# Patient Record
Sex: Male | Born: 1955 | Race: White | Hispanic: No | Marital: Single | Smoking: Current every day smoker
Health system: Southern US, Community
[De-identification: ages and names within clinical notes are randomized; demographics above are authoritative.]

## PROBLEM LIST (undated history)

## (undated) DIAGNOSIS — J45909 Unspecified asthma, uncomplicated: Secondary | ICD-10-CM

## (undated) DIAGNOSIS — F32A Depression, unspecified: Secondary | ICD-10-CM

## (undated) DIAGNOSIS — C801 Malignant (primary) neoplasm, unspecified: Secondary | ICD-10-CM

## (undated) DIAGNOSIS — I10 Essential (primary) hypertension: Secondary | ICD-10-CM

## (undated) DIAGNOSIS — D638 Anemia in other chronic diseases classified elsewhere: Secondary | ICD-10-CM

## (undated) DIAGNOSIS — K259 Gastric ulcer, unspecified as acute or chronic, without hemorrhage or perforation: Secondary | ICD-10-CM

## (undated) DIAGNOSIS — F329 Major depressive disorder, single episode, unspecified: Secondary | ICD-10-CM

## (undated) DIAGNOSIS — F319 Bipolar disorder, unspecified: Secondary | ICD-10-CM

## (undated) DIAGNOSIS — M1990 Unspecified osteoarthritis, unspecified site: Secondary | ICD-10-CM

## (undated) DIAGNOSIS — F419 Anxiety disorder, unspecified: Secondary | ICD-10-CM

## (undated) DIAGNOSIS — M109 Gout, unspecified: Secondary | ICD-10-CM

## (undated) DIAGNOSIS — R0600 Dyspnea, unspecified: Secondary | ICD-10-CM

## (undated) DIAGNOSIS — Z992 Dependence on renal dialysis: Secondary | ICD-10-CM

## (undated) DIAGNOSIS — D696 Thrombocytopenia, unspecified: Secondary | ICD-10-CM

## (undated) DIAGNOSIS — R51 Headache: Secondary | ICD-10-CM

## (undated) DIAGNOSIS — K7460 Unspecified cirrhosis of liver: Secondary | ICD-10-CM

## (undated) DIAGNOSIS — B1920 Unspecified viral hepatitis C without hepatic coma: Secondary | ICD-10-CM

## (undated) DIAGNOSIS — F209 Schizophrenia, unspecified: Secondary | ICD-10-CM

## (undated) DIAGNOSIS — R519 Headache, unspecified: Secondary | ICD-10-CM

## (undated) DIAGNOSIS — N289 Disorder of kidney and ureter, unspecified: Secondary | ICD-10-CM

## (undated) DIAGNOSIS — I482 Chronic atrial fibrillation, unspecified: Secondary | ICD-10-CM

## (undated) DIAGNOSIS — K219 Gastro-esophageal reflux disease without esophagitis: Secondary | ICD-10-CM

## (undated) DIAGNOSIS — N186 End stage renal disease: Secondary | ICD-10-CM

## (undated) DIAGNOSIS — J449 Chronic obstructive pulmonary disease, unspecified: Secondary | ICD-10-CM

## (undated) HISTORY — PX: PERITONEAL WOUND DRAINAGE: SUR425

## (undated) HISTORY — DX: Unspecified asthma, uncomplicated: J45.909

## (undated) HISTORY — DX: Unspecified osteoarthritis, unspecified site: M19.90

## (undated) HISTORY — DX: Gout, unspecified: M10.9

## (undated) HISTORY — DX: Gastro-esophageal reflux disease without esophagitis: K21.9

## (undated) HISTORY — DX: Major depressive disorder, single episode, unspecified: F32.9

## (undated) HISTORY — DX: Anxiety disorder, unspecified: F41.9

## (undated) HISTORY — DX: Depression, unspecified: F32.A

## (undated) HISTORY — DX: Gastric ulcer, unspecified as acute or chronic, without hemorrhage or perforation: K25.9

---

## 2012-02-22 HISTORY — PX: DIALYSIS FISTULA CREATION: SHX611

## 2013-02-21 HISTORY — PX: CARDIAC DEFIBRILLATOR PLACEMENT: SHX171

## 2015-06-29 ENCOUNTER — Encounter: Payer: Self-pay | Admitting: Emergency Medicine

## 2015-06-29 ENCOUNTER — Emergency Department
Admission: EM | Admit: 2015-06-29 | Discharge: 2015-06-29 | Disposition: A | Discharge status: Home / Self Care | Payer: Medicaid Other | Attending: Emergency Medicine | Admitting: Emergency Medicine

## 2015-06-29 DIAGNOSIS — N186 End stage renal disease: Secondary | ICD-10-CM | POA: Insufficient documentation

## 2015-06-29 DIAGNOSIS — F17200 Nicotine dependence, unspecified, uncomplicated: Secondary | ICD-10-CM | POA: Diagnosis not present

## 2015-06-29 DIAGNOSIS — R609 Edema, unspecified: Secondary | ICD-10-CM

## 2015-06-29 DIAGNOSIS — I4891 Unspecified atrial fibrillation: Secondary | ICD-10-CM | POA: Insufficient documentation

## 2015-06-29 DIAGNOSIS — R600 Localized edema: Secondary | ICD-10-CM | POA: Insufficient documentation

## 2015-06-29 DIAGNOSIS — J449 Chronic obstructive pulmonary disease, unspecified: Secondary | ICD-10-CM | POA: Diagnosis not present

## 2015-06-29 DIAGNOSIS — Z992 Dependence on renal dialysis: Secondary | ICD-10-CM | POA: Diagnosis not present

## 2015-06-29 HISTORY — DX: Chronic obstructive pulmonary disease, unspecified: J44.9

## 2015-06-29 LAB — CBC WITH DIFFERENTIAL/PLATELET
BASOS ABS: 0 10*3/uL (ref 0–0.1)
EOS ABS: 0.3 10*3/uL (ref 0–0.7)
Eosinophils Relative: 8 %
HCT: 34.3 % — ABNORMAL LOW (ref 40.0–52.0)
Hemoglobin: 11.7 g/dL — ABNORMAL LOW (ref 13.0–18.0)
Lymphocytes Relative: 19 %
Lymphs Abs: 0.7 10*3/uL — ABNORMAL LOW (ref 1.0–3.6)
MCH: 33.5 pg (ref 26.0–34.0)
MCHC: 34.1 g/dL (ref 32.0–36.0)
MCV: 98.2 fL (ref 80.0–100.0)
MONO ABS: 0.3 10*3/uL (ref 0.2–1.0)
NEUTROS ABS: 2.3 10*3/uL (ref 1.4–6.5)
Neutrophils Relative %: 64 %
PLATELETS: 55 10*3/uL — AB (ref 150–440)
RBC: 3.5 MIL/uL — ABNORMAL LOW (ref 4.40–5.90)
RDW: 15.2 % — AB (ref 11.5–14.5)
WBC: 3.7 10*3/uL — ABNORMAL LOW (ref 3.8–10.6)

## 2015-06-29 LAB — BASIC METABOLIC PANEL
ANION GAP: 15 (ref 5–15)
BUN: 57 mg/dL — ABNORMAL HIGH (ref 6–20)
CALCIUM: 8.9 mg/dL (ref 8.9–10.3)
CO2: 20 mmol/L — AB (ref 22–32)
CREATININE: 8.47 mg/dL — AB (ref 0.61–1.24)
Chloride: 104 mmol/L (ref 101–111)
GFR, EST AFRICAN AMERICAN: 7 mL/min — AB (ref >60)
GFR, EST NON AFRICAN AMERICAN: 6 mL/min — AB (ref >60)
GLUCOSE: 89 mg/dL (ref 65–99)
Potassium: 3.9 mmol/L (ref 3.5–5.1)
Sodium: 139 mmol/L (ref 135–145)

## 2015-06-29 MED ORDER — NICOTINE POLACRILEX 2 MG MT GUM
2.0000 mg | CHEWING_GUM | OROMUCOSAL | Status: DC | PRN
  Filled 2015-06-29: qty 1

## 2015-06-29 NOTE — Care Management (Addendum)
This Probation officer has emailed [employer] liaison for outpatient dialysis after CSW has sent text with urgent need to call discharge planners back.  I spoke with [facility] and they have advised that Antoine Primas [phone] be contacted regarding new dialysis referral. I also spoke with CSW at  [facility] and Dr. Alfonse Alpers has declined patient. I have left message for Lawanna with [facility] to call this RNCM back to discuss "where" patient had dialysis Friday. Apparently patient was just released from jail. CSW is going to speak now with patient.  This Probation officer contacted Antoine Primas with [facility] [phone] to check status of this patient. They do have an open request --from [place] ([place] Ph: [phone] ext. [phone] )- 06/17/15 blood labs were sent. PPD good for 90 days. Hep antigen 30 days. Right now they are up to date on labs. They do not see that Medicaid has been applied for. This Probation officer spoke with [facility] in ED and patient currently does not meet criteria for admission.  This Probation officer attempted to contact [facility] regarding patient status--We need copy of patient's insurance. I was on hold for an extended amount of time and then when I called back number was busy. I have re-contacted [facility] central intake to see if they have a copy of patient's insurance card on file. No insurance on file. Social Security number shared with [facility]- they are going to run insurance (Medicare/Medicaid) and call Mel [facility] CSW back with result. Lawanna called back and prison had resent Medicaid-  But it termed out. Prison said that he would be reactivated. This Probation officer has also requested Medicaid status check with [facility].

## 2015-06-29 NOTE — Clinical Social Work Note (Signed)
Clinical Social Work Assessment  Patient Details  Name: Richard Herring MRN: [id number] Date of Birth: [birth year]  Date of referral:  06/29/15               Reason for consult:  Financial Concerns, Insurance Barriers, [employer], Other (Comment Required) (outpatient dialysis placement )                Permission sought to share information with:  Case Freight forwarder, Other ([place] ) Permission granted to share information::  Yes, Verbal Permission Granted  Name::        Agency::     Relationship::     Contact Information:     Housing/Transportation Living arrangements for the past 2 months:  Catering manager of Information:  Patient Patient Interpreter Needed:  None Criminal Activity/Legal Involvement Pertinent to Current Situation/Hospitalization:  No - Comment as needed Significant Relationships:  Siblings Lives with:  Facility Resident Do you feel safe going back to the place where you live?  Yes Need for family participation in patient care:  No ([place])  Care giving concerns: Patient was released from [facility] on Saturday 06/27/15 and was placed at [facility] in [place].    Social Worker assessment / plan:  Holiday representative ([place]) received a consult for outpatient dialysis center placement. CSW met with patient alone at bedside. Patient was alert and oriented and stated that he was released from  [facility] on Saturday 06/27/15 and has been in prison since 2007. Per patient he was in prison for theft of cars. Patient reported that he was placed from the prison to [facility] in [place]. Per patient the social worker at the prison attempted to find an outpatient dialysis center however they were full in [place]. Patient reported that he cannot read, write or spell. Patient reported that the group home will not give him any information about his parole, his SSI or Medicare or Medicaid.  Patient stated that he does have Medicare and Medicaid but does not know his information. Patient gave CSW permission to contact is Research officer, trade union.   RN Case Manager is assisting with case and has contacted [facility] and the group home. CSW contacted patient's parole officer [place] [phone]. Per parole officer she is scheduled to have a meeting with him tomorrow at 4 pm. Per officer she does not have a lot of information about patient but did state [facility] prison case Freight forwarder was Mrs. Farrley [phone]. RN Case Manager is attempting to reach case manager at [facility]. CSW will continue to follow and assist as needed.    Employment status:  Disabled (Comment on whether or not currently receiving Disability), Unemployed Insurance information:  Self Pay (Medicaid Pending) PT Recommendations:  Not assessed at this time Information / Referral to community resources:  Other (Comment Required) (outpatient dialysis center placement)  Patient/Family's Response to care: Patient is open to getting placed at any outpatient dialysis center that will accept him.   Patient/Family's Understanding of and Emotional Response to Diagnosis, Current Treatment, and Prognosis:  Patient was very pleasant and does not have an outpatient dialysis center placement.   Emotional Assessment Appearance:  Appears stated age Attitude/Demeanor/Rapport:    Affect (typically observed):  Accepting, Adaptable, Pleasant Orientation:  Oriented to Self, Oriented to Place, Oriented to  Time, Oriented to Situation Alcohol / Substance use:  Not Applicable Psych involvement (Current and /or in the community):  No (Comment)  Discharge Needs  Concerns to be addressed:  Discharge Planning Concerns Readmission within the last 30 days:  No Current discharge risk:  Other (Outpatient dialysis placement. ) Barriers to Discharge:  Continued Medical Work up, Waiting for outpatient dialysis   Elwyn Reach 06/29/2015, 11:41 AM

## 2015-06-29 NOTE — ED Notes (Signed)
Pt presents needing dialysis. Pt just got out of prison and is trying to get set up with dialysis clinic. Pt had dialysis on Friday.

## 2015-06-29 NOTE — ED Notes (Signed)
Berdine Dance, [place] transporter [phone]

## 2015-06-29 NOTE — Care Management Note (Signed)
I have spoken directly with the facility administrator at [facility] and was informed that Dr. Candiss Norse and Dr Holley Raring have both denied patient admission at the [facility] clinics they cover as Medical Directors.  [facility] has also denied the patient.  When speaking with the Facility Director she read to me the H and P from the  state prison and it states that patient has multiple occurrences of behavior issues.  Records also state that patient has 50 attempted suicide.  When speaking with CSW Blima Rich she informed me that the patient did not present himself as a behavior problem and was very pleasant.  Plan for now is for patient to return to ED tomorrow for follow up labs to see if he is in need of dialysis.  At this time I have no placement for this patient.  I will continue to work on placing him and hope that maybe with a few successful treatments at the hospital the doctors will see that the patient will work well in a outpatient dialysis center.  I plan to also discuss with Dr. Candiss Norse about the possibility that the  patient my be a candidate for home hemo at any of the area clinics.  This would require the willingness of the group home if they are capable of handling the extra time this would require of them. I will continue to follow patient on each admission for labs and if the need arises for hemodialysis.  Richard Herring  Dialysis Coordinator   [phone]

## 2015-06-29 NOTE — ED Notes (Signed)
Group home staff contacted and informed of pt discharge.

## 2015-06-29 NOTE — Discharge Instructions (Signed)
You were evaluated and you do not need emergency dialysis today.  You'll be called by the social worker to discuss outpatient plan for dialysis.  Return to the emergency department immediately for any chest pain, trouble breathing, shortness of breath, palpitations, confusion altered mental status, or any other symptoms concerning to you.  You will need to come tomorrow to the emergency department for repeat blood draw to evaluate whether or not you need emergency dialysis, if a dialysis appointment has not and scheduled as an outpatient for you.   Peripheral Edema You have swelling in your legs (peripheral edema). This swelling is due to excess accumulation of salt and water in your body. Edema may be a sign of heart, kidney or liver disease, or a side effect of a medication. It may also be due to problems in the leg veins. Elevating your legs and using special support stockings may be very helpful, if the cause of the swelling is due to poor venous circulation. Avoid long periods of standing, whatever the cause. Treatment of edema depends on identifying the cause. Chips, pretzels, pickles and other salty foods should be avoided. Restricting salt in your diet is almost always needed. Water pills (diuretics) are often used to remove the excess salt and water from your body via urine. These medicines prevent the kidney from reabsorbing sodium. This increases urine flow. Diuretic treatment may also result in lowering of potassium levels in your body. Potassium supplements may be needed if you have to use diuretics daily. Daily weights can help you keep track of your progress in clearing your edema. You should call your caregiver for follow up care as recommended. SEEK IMMEDIATE MEDICAL CARE IF:   You have increased swelling, pain, redness, or heat in your legs.  You develop shortness of breath, especially when lying down.  You develop chest or abdominal pain, weakness, or fainting.  You have a  fever.   This information is not intended to replace advice given to you by your health care provider. Make sure you discuss any questions you have with your health care provider.   Document Released: 03/17/2004 Document Revised: 05/02/2011 Document Reviewed: 08/20/2014 Elsevier Interactive Patient Education [employer].

## 2015-06-29 NOTE — Progress Notes (Signed)
Per MD patient does not require emergent dialysis and will be discharged back to group home today. Per MD D/C instructions to patient is to return to the ED tomorrow and get blood work to see if he needs dialysis. Cleophus Molt group home owner is aware of above. [facility] liaison is aware of above and is working on outpatient dialysis placement. CSW left [facility] Medicaid worker a Advertising account executive.   Blima Rich, LCSW [phone]

## 2015-06-29 NOTE — Progress Notes (Signed)
Clinical Education officer, museum (CSW) received a call from MD stating that patient was released from prison and needs to be set up with an outpatient dialysis center. Per MD patient stated that the social worker at the prison called the 2 dialysis centers in [place] and they did not have any slots and told patient to come to the emergency room. CSW left [facility]aud[facility] dialysis liaison a voicemail about situation. CSW will continue to follow and assist as needed.   Blima Rich, LCSW [phone]

## 2015-06-29 NOTE — ED Provider Notes (Signed)
[facility] Emergency Department Provider Note   ____________________________________________  Time seen: Approximately 9 AM I have reviewed the triage vital signs and the triage nursing note.  HISTORY  Chief Complaint needs dialysis    Historian Patient  HPI Richard Herring is a 60 y.o. male who was recently released from prison, and receives dialysis Monday Wednesday and Friday, with his last dialysis on Friday. He states that the social worker imprison was trying to get him set up for outpatient dialysis here in [place], but stated that the 2 places were full. They told him to come to the emergency department for dialysis.  No chest pain trouble breathing. He is a smoker. No recent illnesses. He does think he has little bit of fluid on him, and is referring to small amount of leg edema and hand edema.  He is currently staying at a group home.    Past Medical History  Diagnosis Date  . Renal disorder   . COPD (chronic obstructive pulmonary disease) ([place])   . 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 Acute gastroenteritis 07/01/2015    History reviewed. No pertinent past surgical history.  No current outpatient prescriptions on file.  Allergies Review of patient's allergies indicates no known allergies.  History reviewed. No pertinent family history.  Social History Social History  Substance Use Topics  . Smoking status: Current Every Day Smoker  . Smokeless tobacco: None  . Alcohol Use: No    Review of Systems  Constitutional: Negative for fever. Eyes: Negative for visual changes. ENT: Negative for sore throat. Cardiovascular: Negative for chest pain. Respiratory: Negative for shortness of breath. Gastrointestinal: Negative for abdominal pain, vomiting and diarrhea. Genitourinary: Negative for dysuria. Musculoskeletal: Negative for back pain. Skin:  Negative for rash. Neurological: Negative for headache. 10 point Review of Systems otherwise negative ____________________________________________   PHYSICAL EXAM:  VITAL SIGNS: ED Triage Vitals  Enc Vitals Group     BP 06/29/15 0822 123/68 mmHg     Pulse Rate 06/29/15 0822 62     Resp 06/29/15 0822 18     Temp 06/29/15 0822 97.8 F (36.6 C)     Temp Source 06/29/15 0822 Oral     SpO2 06/29/15 0822 96 %     Weight 06/29/15 0822 138 lb (62.596 kg)     Height 06/29/15 0822 '5\' 11"'$  (1.803 m)     Head Cir --      Peak Flow --      Pain Score 06/29/15 0824 6     Pain Loc --      Pain Edu? --      Excl. in [place]? --      Constitutional: Alert and oriented. Well appearing and in no distress. HEENT   Head: Normocephalic and atraumatic.      Eyes: Conjunctivae are normal. PERRL. Normal extraocular movements.      Ears:         Nose: No congestion/rhinnorhea.   Mouth/Throat: Mucous membranes are moist.   Neck: No stridor. Cardiovascular/Chest: Normal rate, regular rhythm.  No murmurs, rubs, or gallops. Respiratory: Normal respiratory effort without tachypnea nor retractions. Breath sounds are clear and equal bilaterally. No wheezes/rales/rhonchi. Gastrointestinal: Soft. No distention, no guarding, no rebound. Nontender.    Genitourinary/rectal:Deferred Musculoskeletal: Left upper extremity dialysis access. Neurologic:  Normal speech and language. No gross or focal neurologic deficits are appreciated. Skin:  Skin  is warm, dry and intact. No rash noted. Psychiatric: Mood and affect are normal. Speech and behavior are normal. Patient exhibits appropriate insight and judgment.  ____________________________________________   EKG I, Lisa Roca, MD, the attending physician have personally viewed and interpreted all ECGs.  None ____________________________________________  LABS (pertinent positives/negatives)  Basic metabolic panel significant for BUN 57 and creatinine  8.47, potassium is 3.9 White blood count 3.7, hemoglobin 11.7 and platelet count 55  ____________________________________________  RADIOLOGY All Xrays were viewed by me. Imaging interpreted by Radiologist.  none __________________________________________  PROCEDURES  Procedure(s) performed: None  Critical Care performed: None  ____________________________________________   ED COURSE / ASSESSMENT AND PLAN  Pertinent labs & imaging results that were available during my care of the patient were reviewed by me and considered in my medical decision making (see chart for details).   I ordered labs to see if the patient needs emergency dialysis, and have social worker help with outpatient follow-up for dialysis.   No hyperkalemia, and no hypoxia or trouble breathing, no indication for acute/emergent dialysis at this moment.  Social worker Transport planner working with dialysis coordinator to try to figure out a solution for outpatient routine dialysis for this patient.  Patient would like to go ahead and be discharged, and daily will call the group home to follow-up with this plan.  I've asked him to come back tomorrow for blood work and daily until he has a scheduled outpatient dialysis to monitor for the need of emergent dialysis. Certainly we also discussed symptomatic reasons for return to emergency department.    CONSULTATIONS:   Education officer, museum, Transport planner.  Patient / Family / Caregiver informed of clinical course, medical decision-making process, and agree with plan.   I discussed return precautions, follow-up instructions, and discharged instructions with patient and/or family.   ___________________________________________   FINAL CLINICAL IMPRESSION(S) / ED DIAGNOSES   Final diagnoses:  Peripheral edema              Note: This dictation was prepared with Dragon dictation. Any transcriptional errors that result from this process are unintentional   Lisa Roca, MD 07/02/15 [phone]

## 2015-06-30 ENCOUNTER — Emergency Department
Admission: EM | Admit: 2015-06-30 | Discharge: 2015-06-30 | Disposition: A | Discharge status: Home / Self Care | Payer: Medicaid Other | Attending: Emergency Medicine | Admitting: Emergency Medicine

## 2015-06-30 ENCOUNTER — Encounter: Payer: Self-pay | Admitting: Emergency Medicine

## 2015-06-30 ENCOUNTER — Emergency Department: Payer: Medicaid Other

## 2015-06-30 DIAGNOSIS — Z7982 Long term (current) use of aspirin: Secondary | ICD-10-CM | POA: Insufficient documentation

## 2015-06-30 DIAGNOSIS — J449 Chronic obstructive pulmonary disease, unspecified: Secondary | ICD-10-CM | POA: Insufficient documentation

## 2015-06-30 DIAGNOSIS — K7460 Unspecified cirrhosis of liver: Secondary | ICD-10-CM | POA: Diagnosis not present

## 2015-06-30 DIAGNOSIS — K8020 Calculus of gallbladder without cholecystitis without obstruction: Secondary | ICD-10-CM | POA: Diagnosis not present

## 2015-06-30 DIAGNOSIS — F17200 Nicotine dependence, unspecified, uncomplicated: Secondary | ICD-10-CM | POA: Diagnosis not present

## 2015-06-30 DIAGNOSIS — Z79899 Other long term (current) drug therapy: Secondary | ICD-10-CM | POA: Diagnosis not present

## 2015-06-30 DIAGNOSIS — N185 Chronic kidney disease, stage 5: Secondary | ICD-10-CM | POA: Insufficient documentation

## 2015-06-30 DIAGNOSIS — I4891 Unspecified atrial fibrillation: Secondary | ICD-10-CM | POA: Diagnosis not present

## 2015-06-30 DIAGNOSIS — R918 Other nonspecific abnormal finding of lung field: Secondary | ICD-10-CM | POA: Insufficient documentation

## 2015-06-30 DIAGNOSIS — Z452 Encounter for adjustment and management of vascular access device: Secondary | ICD-10-CM | POA: Diagnosis present

## 2015-06-30 DIAGNOSIS — R188 Other ascites: Secondary | ICD-10-CM

## 2015-06-30 LAB — COMPREHENSIVE METABOLIC PANEL
ALK PHOS: 53 U/L (ref 38–126)
ALT: 68 U/L — ABNORMAL HIGH (ref 17–63)
AST: 67 U/L — ABNORMAL HIGH (ref 15–41)
Albumin: 3.4 g/dL — ABNORMAL LOW (ref 3.5–5.0)
Anion gap: 12 (ref 5–15)
BILIRUBIN TOTAL: 0.8 mg/dL (ref 0.3–1.2)
BUN: 65 mg/dL — AB (ref 6–20)
CALCIUM: 8.8 mg/dL — AB (ref 8.9–10.3)
CO2: 22 mmol/L (ref 22–32)
CREATININE: 9.34 mg/dL — AB (ref 0.61–1.24)
Chloride: 107 mmol/L (ref 101–111)
GFR calc Af Amer: 6 mL/min — ABNORMAL LOW (ref >60)
GFR, EST NON AFRICAN AMERICAN: 5 mL/min — AB (ref >60)
GLUCOSE: 88 mg/dL (ref 65–99)
POTASSIUM: 3.8 mmol/L (ref 3.5–5.1)
Sodium: 141 mmol/L (ref 135–145)
TOTAL PROTEIN: 7 g/dL (ref 6.5–8.1)

## 2015-06-30 LAB — CBC WITH DIFFERENTIAL/PLATELET
Basophils Absolute: 0 10*3/uL (ref 0–0.1)
Basophils Relative: 1 %
Eosinophils Absolute: 0.3 10*3/uL (ref 0–0.7)
Eosinophils Relative: 8 %
HEMATOCRIT: 36.9 % — AB (ref 40.0–52.0)
HEMOGLOBIN: 12.4 g/dL — AB (ref 13.0–18.0)
LYMPHS ABS: 0.7 10*3/uL — AB (ref 1.0–3.6)
Lymphocytes Relative: 20 %
MCH: 33.3 pg (ref 26.0–34.0)
MCHC: 33.6 g/dL (ref 32.0–36.0)
MCV: 99.3 fL (ref 80.0–100.0)
MONO ABS: 0.2 10*3/uL (ref 0.2–1.0)
Monocytes Relative: 6 %
NEUTROS ABS: 2.1 10*3/uL (ref 1.4–6.5)
Platelets: 54 10*3/uL — ABNORMAL LOW (ref 150–440)
RBC: 3.71 MIL/uL — ABNORMAL LOW (ref 4.40–5.90)
RDW: 15.8 % — ABNORMAL HIGH (ref 11.5–14.5)
WBC: 3.3 10*3/uL — ABNORMAL LOW (ref 3.8–10.6)

## 2015-06-30 NOTE — ED Notes (Signed)
Reports just got out of prison where he was receiving dialysis 3 times a wk, last received it on last Friday.  Seen here yesterday and was told to return today for repeat blood work and dialysis.  NAD

## 2015-06-30 NOTE — ED Notes (Signed)
Pt transported to CT.Pt ambulatory independently. Pt in NAD.

## 2015-06-30 NOTE — Discharge Instructions (Signed)
You need to make sure to follow up with your primary care M.D. concerning urine dialysis and was social services, they are trying to help start 2 with a dialysis treatment. You do have gallstones as well as possible cirrhosis of the liver and neck and be followed up initially with primary care but she may need to see a surgeon if the gallstones*causing pain. 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 We are going to refer you to a pulmonary specialist to have an evaluation done.

## 2015-06-30 NOTE — ED Notes (Signed)
Pt ambulatory and waiting in lobby for David from group home to pick him up. Shanon Brow was called by RN and reports he will be to ED in 30 minutes.

## 2015-06-30 NOTE — ED Notes (Signed)
Discharge papers explained to pt. Symbols drawn next to pts follow ups for lung biopsy because pt reports he can not read or write. Pt verbalized he will discuss symbols and follow up needs with group home. Pt also reports he understands the need to monitor abd pain and if pains increase to return to ED for gallstones. Pt remains able to ambulate independently and without distress. Pt reports he knows to return to ED tomorrow for repeat labs.

## 2015-06-30 NOTE — Progress Notes (Signed)
Patient presented to the ED for labs today to see if he requires dialysis. [facility] dialysis liaison is working on outpatient dialysis placement and will explore home dialysis. Clinical Education officer, museum ([place]) received call from [employer] worker confirming that patient has Medicaid out of [facility]. Medicaid # [id number] S. CSW contacted [facility] registration and made then aware of above. CSW also made Kim aware of above. Plan if for patient to return to group home today when stable.  Blima Rich, LCSW [phone]

## 2015-06-30 NOTE — Care Management Note (Signed)
Case Management Note  Patient Details  Name: Richard Herring MRN: [id number] Date of Birth: Nov 30, 1955  Subjective/Objective: Clarifying information in earlier note; [facility] is working on OP placement, not [place], [place] . All efforts to place him have failed so far. Plan is for pt. To come to ER for labs daily until such time as he requires HD per medical staff.       Updated Mel Almond to the fact that the pt. Is having a CT chest today, but expected to d/c back to group home.              Action/Plan:   Expected Discharge Date:                  Expected Discharge Plan:     In-House Referral:     Discharge planning Services     Post Acute Care Choice:    Choice offered to:     DME Arranged:    DME Agency:     HH Arranged:    [place] Agency:     Status of Service:     Medicare Important Message Given:    Date Medicare IM Given:    Medicare IM give by:    Date Additional Medicare IM Given:    Additional Medicare Important Message give by:     If discussed at Central of Stay Meetings, dates discussed:    Additional Comments:  Beau Fanny, RN 06/30/2015, 1:39 PM

## 2015-06-30 NOTE — Care Management Note (Signed)
Case Management Note  Patient Details  Name: Richard Herring MRN: [id number] Date of Birth: [birth year]  Subjective/Objective:        Spoke to patient with Dr Richard Herring present. Explained to him that right now he is going to be worked up to see if he needs diaysis. I have explained to him that Richard Herring , CSW . Is trying to get him set up , with help from Richard Herring, and are exploring home Dialysis for him. He is not accepted locally into any centers so far due to his behavioural history. He continues to be very pleasant and amenable.  He is complaining of some fluid he feels is building up. Dr Richard Herring is aware and ordering a CXR for the patient in addition to labs. A text message has been sent to CSW to let Richard Herring know the patient has presented for labs/ workup. She is aware and replies they are still working on a HD home for the patient.         Action/Plan:   Expected Discharge Date:                  Expected Discharge Plan:     In-House Referral:     Discharge planning Services     Post Acute Care Choice:    Choice offered to:     DME Arranged:    DME Agency:     HH Arranged:    [place] Agency:     Status of Service:     Medicare Important Message Given:    Date Medicare IM Given:    Medicare IM give by:    Date Additional Medicare IM Given:    Additional Medicare Important Message give by:     If discussed at [place] of Stay Meetings, dates discussed:    Additional Comments:  Beau Fanny, RN 06/30/2015, 10:51 AM

## 2015-06-30 NOTE — ED Notes (Signed)
Pt has returned from CT. Pt continues to ambulate independently and in NAD distress. Pt

## 2015-06-30 NOTE — ED Provider Notes (Signed)
[facility] Emergency Department Provider Note  _________________________________________  Time seen: Approximately 11:07 AM  I have reviewed the triage vital signs and the nursing notes.   HISTORY  Chief Complaint Vascular Access Problem    HPI Richard Herring is a 60 y.o. male who is here trying to get dialysis. Patient was seen yesterday for the same but has lab drawn normal. Patient's last dialysis was this past Friday. The patient was recently out of jail and is in a group home, and has not yet been established with the dialysis center. The only reason patient can stay in the hospital to have dialysis is if it is emergent. Yesterday they did not feel he needed emergency dialysis and started case management assistance. Today patient states that he feels like he is short of breath. He denies any other symptoms except for he states that his legs are swollen as well. Patient does not have any significant pain and he has had no recent illnesses, nausea, vomiting, diarrhea, cough or cold symptoms.   Past Medical History  Diagnosis Date  . Renal disorder   . COPD (chronic obstructive pulmonary disease) (HCC)     There are no active problems to display for this patient.   History reviewed. No pertinent past surgical history.  Current Outpatient Rx  Name  Route  Sig  Dispense  Refill  . aspirin EC 81 MG tablet   Oral   Take 81 mg by mouth daily.         Marland Kitchen b complex vitamins capsule   Oral   Take 1 capsule by mouth daily.         . cyproheptadine (PERIACTIN) 4 MG tablet   Oral   Take 4 mg by mouth 3 (three) times daily.         . folic acid (FOLVITE) 1 MG tablet   Oral   Take 1 mg by mouth daily.         . furosemide (LASIX) 40 MG tablet   Oral   Take 80 mg by mouth 2 (two) times daily.         Marland Kitchen lactulose (CHRONULAC) 10 GM/15ML solution   Oral   Take 30 g by mouth 3 (three) times daily.         . metoprolol (LOPRESSOR) 50 MG  tablet   Oral   Take 50 mg by mouth 3 (three) times daily.         . ranitidine (ZANTAC) 300 MG tablet   Oral   Take 300 mg by mouth 2 (two) times daily.         . sevelamer carbonate (RENVELA) 800 MG tablet   Oral   Take 1,600 mg by mouth 3 (three) times daily with meals.         . traMADol (ULTRAM) 50 MG tablet   Oral   Take 50 mg by mouth every 8 (eight) hours as needed.         . vitamin C (ASCORBIC ACID) 500 MG tablet   Oral   Take 500 mg by mouth every 12 (twelve) hours.           Allergies Review of patient's allergies indicates no known allergies.  No family history on file.  Social History Social History  Substance Use Topics  . Smoking status: Current Every Day Smoker  . Smokeless tobacco: None  . Alcohol Use: No    Review of Systems Constitutional: No fever/chills Eyes: No visual  changes. ENT: No sore throat. Cardiovascular: Denies chest pain. Respiratory: Positive for shortness of breath. Gastrointestinal: No abdominal pain.  No nausea, no vomiting.  No diarrhea.  No constipation. Genitourinary: Negative for dysuria. Musculoskeletal: Negative for back pain.Patient complained of swelling in both legs. Skin: Negative for rash. Neurological: Negative for headaches, focal weakness or numbness.  10-point ROS otherwise negative.  ____________________________________________   PHYSICAL EXAM:  VITAL SIGNS: ED Triage Vitals  Enc Vitals Group     BP --      Pulse --      Resp --      Temp --      Temp src --      SpO2 --      Weight --      Height --      Head Cir --      Peak Flow --      Pain Score 06/30/15 0920 0     Pain Loc --      Pain Edu? --      Excl. in [place]? --     Constitutional: Alert and oriented. Well appearing and in no acute distress. Eyes: Conjunctivae are normal. PERRL. EOMI. Head: Atraumatic. Nose: No congestion/rhinnorhea. Mouth/Throat: Mucous membranes are moist.  Oropharynx non-erythematous. Neck: No  stridor.   Cardiovascular: Normal rate, regular rhythm. Grossly normal heart sounds.  Good peripheral circulation. Respiratory: Normal respiratory effort.  No retractions. Lungs CTAB. Gastrointestinal: Soft and nontender. No distention. No abdominal bruits. No CVA tenderness. Musculoskeletal: No lower extremity tenderness and only mild edema.  No joint effusions. Patient with dialysis fistula in his left arm with a good thrill. Neurologic:  Normal speech and language. No gross focal neurologic deficits are appreciated. No gait instability. Skin:  Skin is warm, dry and intact. No rash noted. Psychiatric: Mood and affect are normal. Speech and behavior are normal.  ____________________________________________   LABS (all labs ordered are listed, but only abnormal results are displayed)  Labs Reviewed  CBC WITH DIFFERENTIAL/PLATELET - Abnormal; Notable for the following:    WBC 3.3 (*)    RBC 3.71 (*)    Hemoglobin 12.4 (*)    HCT 36.9 (*)    RDW 15.8 (*)    Platelets 54 (*)    Lymphs Abs 0.7 (*)    All other components within normal limits  COMPREHENSIVE METABOLIC PANEL - Abnormal; Notable for the following:    BUN 65 (*)    Creatinine, Ser 9.34 (*)    Calcium 8.8 (*)    Albumin 3.4 (*)    AST 67 (*)    ALT 68 (*)    GFR calc non Af Amer 5 (*)    GFR calc Af Amer 6 (*)    All other components within normal limits   ____________________________________________  EKG   ____________________________________________  RADIOLOGY  Dg Chest 2 View  06/30/2015  CLINICAL DATA:  Shortness of breath, renal failure, end-stage renal disease on dialysis EXAM: CHEST  2 VIEW COMPARISON:  None FINDINGS: Loop recorder projects over anterior LEFT chest. Normal heart size, mediastinal contours and pulmonary vascularity. Questionable density at the LEFT upper lobe may be related to the anterior LEFT first costochondral junction but a LEFT upper lobe nodule is not excluded, 2.4 x 1.3 cm in size.  Underlying emphysematous and bronchitic changes. Minimal bibasilar atelectasis. No acute infiltrate, pleural effusion or pneumothorax. Surgical absence versus resorption of distal RIGHT clavicle. No acute osseous findings. IMPRESSION: Emphysematous and bronchitic changes consistent with COPD.  Question small nodular density LEFT apex versus sclerosis at the LEFT first costochondral junction; if patient has prior outside exams to the should be obtained for comparison to establish stability. In the absence of prior exams, CT chest without contrast recommended to exclude LEFT upper lobe pulmonary nodule. Electronically Signed   By: Lavonia Dana M.D.   On: 06/30/2015 11:11   Ct Chest Wo Contrast  06/30/2015  CLINICAL DATA:  Abnormal chest x-ray EXAM: CT CHEST WITHOUT CONTRAST TECHNIQUE: Multidetector CT imaging of the chest was performed following the standard protocol without IV contrast. COMPARISON:  Chest x-ray 06/30/2015 FINDINGS: Mediastinum/Lymph Nodes: Images of the thoracic inlet are unremarkable. Central airways are patent. Atherosclerotic calcifications of thoracic aorta and coronary arteries. No mediastinal hematoma or adenopathy. No hilar adenopathy is noted on this unenhanced scan. Heart size within normal limits. No pericardial effusion. Small hiatal hernia. Mild thickening of distal esophageal wall. Gastroesophageal reflux disease cannot be excluded. Clinical correlation is necessary. Lungs/Pleura: There is no pleural thickening of pleural effusion. Mild emphysematous changes are noted bilateral upper lobes. No infiltrate or pulmonary edema. As noted on chest x-ray there is elongated somewhat spiculated nodular consolidation in left upper lobe measures at least 2.1 cm by 1.5 cm. This is highly suspicious for malignancy. Further correlation with PET scan and/or biopsy is recommended. Upper abdomen: There is subtle micro nodular liver contour. Small perihepatic ascites. Chronic liver disease or early  cirrhosis cannot be excluded. High density material noted within contracted gallbladder. This may be due to inspissated sludge or multiple small calcified gallstones. There is thickening of gallbladder wall up to 6 mm. Further correlation with gallbladder ultrasound could be performed as clinically warranted. The spleen measures 13.8 cm in length. Unenhanced pancreas is unremarkable. Portal vein measures 1.4 cm in diameter. Portal hypertension cannot be excluded. Bilateral atrophic kidneys with cortical thinning. Musculoskeletal: Sagittal images of the spine shows mild degenerative changes thoracolumbar spine. No destructive bony lesions are noted. Sagittal view of the sternum is unremarkable. IMPRESSION: 1. There is elongated spiculated nodular lesion in left upper lobe measures at least 2.1 x 1.5 cm. This is highly suspicious for malignancy. Further evaluation with PET scan and/or biopsy is recommended. 2. Mild emphysematous changes bilateral upper lobes. No acute infiltrate or pulmonary edema. 3. Small hiatal hernia. There is mild thickening of distal esophageal wall. Gastroesophageal reflux or esophagitis cannot be excluded. Clinical correlation is necessary. 4. Mild splenomegaly. Subtle micro nodular liver contour and small perihepatic ascites. Early cirrhosis or chronic hepatic ascites cannot be excluded. Clinical correlation is necessary. Prominent size portal vein. Portal hypertension cannot be excluded. 5. There is thickening of gallbladder wall. The gallbladder is contracted. High-density material within gallbladder may represent sludge or multiple small calcified gallstones. Further correlation with gallbladder ultrasound is recommended. 6. Bilateral atrophic kidneys with cortical thinning. These results were called by telephone at the time of interpretation on 06/30/2015 at 1:47 pm to Dr. Marta Antu , who verbally acknowledged these results. Electronically Signed   By: Lahoma Crocker M.D.   On: 06/30/2015  13:48    ____________________________________________   PROCEDURES  Procedure(s) performed: None  Critical Care performed: No  ____________________________________________   INITIAL IMPRESSION / ASSESSMENT AND PLAN / ED COURSE  Pertinent labs & imaging results that were available during my care of the patient were reviewed by me and considered in my medical decision making (see chart for details).  4:08 PM Patient with a chest x-ray routine labs were normal again. Our case management  team is assessing the situation.  4:08 PM Patient's chest x-ray showed a questionable mass in his left upper lung and a CT chest was recommended. CT of the chest did show a spiculated 2.2 cm mass in his left upper lobe behind his first ribsuspicious for lung cancer. CT chest also showed that he had some trace ascites and questionable cirrhosis of the liver as well as he had multiple gallstones. Patient was told of all these findings and is going to be referred to pulmonary. Patient is living in a group home situation and his Medicaid application is still in process. He is currently trying to seek care and has a Education officer, museum working on getting dialysis for him on a regular basis, now he is also going to need to see a pulmonary specialist to get a biopsy of his left upper lobe mass. We have including case management and social services on this note and patient is going to be followed up as an outpatient. ____________________________________________   FINAL CLINICAL IMPRESSION(S) / ED DIAGNOSES  Final diagnoses:  Chronic renal failure, stage 5 ([place])  Lung mass  Gallstones  Cirrhosis of liver with ascites, unspecified hepatic cirrhosis type ([place])      NEW MEDICATIONS STARTED DURING THIS VISIT:  Discharge Medication List as of 06/30/2015  2:02 PM       Note:  This document was prepared using Dragon voice recognition software and may include unintentional dictation errors.    Ruby Cola,  MD 06/30/15 [phone]

## 2015-07-01 ENCOUNTER — Observation Stay
Admission: EM | Admit: 2015-07-01 | Discharge: 2015-07-02 | Payer: Medicaid Other | Attending: Internal Medicine | Admitting: Internal Medicine

## 2015-07-01 ENCOUNTER — Encounter: Payer: Self-pay | Admitting: *Deleted

## 2015-07-01 DIAGNOSIS — D631 Anemia in chronic kidney disease: Secondary | ICD-10-CM | POA: Diagnosis not present

## 2015-07-01 DIAGNOSIS — R161 Splenomegaly, not elsewhere classified: Secondary | ICD-10-CM | POA: Insufficient documentation

## 2015-07-01 DIAGNOSIS — K529 Noninfective gastroenteritis and colitis, unspecified: Secondary | ICD-10-CM | POA: Diagnosis present

## 2015-07-01 DIAGNOSIS — R188 Other ascites: Secondary | ICD-10-CM | POA: Insufficient documentation

## 2015-07-01 DIAGNOSIS — Z8249 Family history of ischemic heart disease and other diseases of the circulatory system: Secondary | ICD-10-CM | POA: Insufficient documentation

## 2015-07-01 DIAGNOSIS — K449 Diaphragmatic hernia without obstruction or gangrene: Secondary | ICD-10-CM | POA: Diagnosis not present

## 2015-07-01 DIAGNOSIS — B1920 Unspecified viral hepatitis C without hepatic coma: Secondary | ICD-10-CM | POA: Diagnosis not present

## 2015-07-01 DIAGNOSIS — Z992 Dependence on renal dialysis: Secondary | ICD-10-CM | POA: Insufficient documentation

## 2015-07-01 DIAGNOSIS — Z950 Presence of cardiac pacemaker: Secondary | ICD-10-CM | POA: Diagnosis not present

## 2015-07-01 DIAGNOSIS — D696 Thrombocytopenia, unspecified: Secondary | ICD-10-CM | POA: Insufficient documentation

## 2015-07-01 DIAGNOSIS — J449 Chronic obstructive pulmonary disease, unspecified: Secondary | ICD-10-CM | POA: Diagnosis not present

## 2015-07-01 DIAGNOSIS — F329 Major depressive disorder, single episode, unspecified: Secondary | ICD-10-CM | POA: Diagnosis not present

## 2015-07-01 DIAGNOSIS — R197 Diarrhea, unspecified: Secondary | ICD-10-CM | POA: Insufficient documentation

## 2015-07-01 DIAGNOSIS — R112 Nausea with vomiting, unspecified: Secondary | ICD-10-CM | POA: Diagnosis present

## 2015-07-01 DIAGNOSIS — R918 Other nonspecific abnormal finding of lung field: Secondary | ICD-10-CM | POA: Insufficient documentation

## 2015-07-01 DIAGNOSIS — N186 End stage renal disease: Principal | ICD-10-CM | POA: Insufficient documentation

## 2015-07-01 DIAGNOSIS — N2581 Secondary hyperparathyroidism of renal origin: Secondary | ICD-10-CM | POA: Insufficient documentation

## 2015-07-01 DIAGNOSIS — R932 Abnormal findings on diagnostic imaging of liver and biliary tract: Secondary | ICD-10-CM | POA: Insufficient documentation

## 2015-07-01 DIAGNOSIS — Z79899 Other long term (current) drug therapy: Secondary | ICD-10-CM | POA: Insufficient documentation

## 2015-07-01 DIAGNOSIS — F17200 Nicotine dependence, unspecified, uncomplicated: Secondary | ICD-10-CM | POA: Diagnosis not present

## 2015-07-01 DIAGNOSIS — Z7982 Long term (current) use of aspirin: Secondary | ICD-10-CM | POA: Diagnosis not present

## 2015-07-01 DIAGNOSIS — I4891 Unspecified atrial fibrillation: Secondary | ICD-10-CM | POA: Insufficient documentation

## 2015-07-01 DIAGNOSIS — R600 Localized edema: Secondary | ICD-10-CM | POA: Insufficient documentation

## 2015-07-01 LAB — GASTROINTESTINAL PANEL BY PCR, STOOL (REPLACES STOOL CULTURE)
ASTROVIRUS: NOT DETECTED
Adenovirus F40/41: NOT DETECTED
CAMPYLOBACTER SPECIES: NOT DETECTED
Cryptosporidium: NOT DETECTED
Cyclospora cayetanensis: NOT DETECTED
E. COLI O157: NOT DETECTED
ENTEROAGGREGATIVE E COLI (EAEC): NOT DETECTED
ENTEROTOXIGENIC E COLI (ETEC): NOT DETECTED
Entamoeba histolytica: NOT DETECTED
Enteropathogenic E coli (EPEC): NOT DETECTED
Giardia lamblia: NOT DETECTED
NOROVIRUS GI/GII: NOT DETECTED
PLESIMONAS SHIGELLOIDES: NOT DETECTED
Rotavirus A: NOT DETECTED
SALMONELLA SPECIES: NOT DETECTED
SAPOVIRUS (I, II, IV, AND V): NOT DETECTED
SHIGA LIKE TOXIN PRODUCING E COLI (STEC): NOT DETECTED
SHIGELLA/ENTEROINVASIVE E COLI (EIEC): NOT DETECTED
Vibrio cholerae: NOT DETECTED
Vibrio species: NOT DETECTED
Yersinia enterocolitica: NOT DETECTED

## 2015-07-01 LAB — CBC
HCT: 37 % — ABNORMAL LOW (ref 40.0–52.0)
HEMOGLOBIN: 12.5 g/dL — AB (ref 13.0–18.0)
MCH: 33.4 pg (ref 26.0–34.0)
MCHC: 33.9 g/dL (ref 32.0–36.0)
MCV: 98.5 fL (ref 80.0–100.0)
PLATELETS: 61 10*3/uL — AB (ref 150–440)
RBC: 3.76 MIL/uL — AB (ref 4.40–5.90)
RDW: 15.5 % — ABNORMAL HIGH (ref 11.5–14.5)
WBC: 5.3 10*3/uL (ref 3.8–10.6)

## 2015-07-01 LAB — COMPREHENSIVE METABOLIC PANEL
ALK PHOS: 53 U/L (ref 38–126)
ALT: 65 U/L — AB (ref 17–63)
ANION GAP: 11 (ref 5–15)
AST: 57 U/L — ABNORMAL HIGH (ref 15–41)
Albumin: 3.6 g/dL (ref 3.5–5.0)
BILIRUBIN TOTAL: 0.5 mg/dL (ref 0.3–1.2)
BUN: 70 mg/dL — ABNORMAL HIGH (ref 6–20)
CALCIUM: 8.9 mg/dL (ref 8.9–10.3)
CO2: 19 mmol/L — ABNORMAL LOW (ref 22–32)
CREATININE: 10.09 mg/dL — AB (ref 0.61–1.24)
Chloride: 112 mmol/L — ABNORMAL HIGH (ref 101–111)
GFR, EST AFRICAN AMERICAN: 6 mL/min — AB (ref >60)
GFR, EST NON AFRICAN AMERICAN: 5 mL/min — AB (ref >60)
Glucose, Bld: 98 mg/dL (ref 65–99)
Potassium: 4.1 mmol/L (ref 3.5–5.1)
SODIUM: 142 mmol/L (ref 135–145)
TOTAL PROTEIN: 7.2 g/dL (ref 6.5–8.1)

## 2015-07-01 LAB — PHOSPHORUS: PHOSPHORUS: 4 mg/dL (ref 2.5–4.6)

## 2015-07-01 LAB — ETHANOL

## 2015-07-01 MED ORDER — NICOTINE 21 MG/24HR TD PT24
MEDICATED_PATCH | TRANSDERMAL | Status: AC
  Filled 2015-07-01: qty 1

## 2015-07-01 MED ORDER — TRAMADOL HCL 50 MG PO TABS: 50.0000 mg | ORAL_TABLET | Freq: Four times a day (QID) | ORAL | Status: DC | PRN

## 2015-07-01 MED ORDER — NICOTINE 21 MG/24HR TD PT24
21.0000 mg | MEDICATED_PATCH | Freq: Once | TRANSDERMAL | Status: AC
  Administered 2015-07-01: 21 mg via TRANSDERMAL

## 2015-07-01 MED ORDER — TRAMADOL HCL 50 MG PO TABS
50.0000 mg | ORAL_TABLET | Freq: Two times a day (BID) | ORAL | Status: DC | PRN
  Administered 2015-07-02: 50 mg via ORAL
  Filled 2015-07-01: qty 1

## 2015-07-01 MED ORDER — ONDANSETRON HCL 4 MG/2ML IJ SOLN: 4.0000 mg | Freq: Once | INTRAMUSCULAR | Status: DC

## 2015-07-01 MED ORDER — ONDANSETRON HCL 4 MG/2ML IJ SOLN
4.0000 mg | Freq: Once | INTRAMUSCULAR | Status: AC
  Administered 2015-07-01: 4 mg via INTRAVENOUS
  Filled 2015-07-01: qty 2

## 2015-07-01 MED ORDER — ONDANSETRON HCL 4 MG/2ML IJ SOLN: 4.0000 mg | Freq: Four times a day (QID) | INTRAMUSCULAR | Status: DC | PRN

## 2015-07-01 NOTE — Progress Notes (Signed)
PRE HD   

## 2015-07-01 NOTE — Consult Note (Signed)
[facility]  CONSULT NOTE    Date: 07/01/2015                  Patient Name:  Richard Herring  MRN: [id number]  DOB: [birth year]  Age / Sex: 60 y.o., male         PCP: Pcp Not In System                 Service Requesting Consult: Dr. Margaretmary Eddy                 Reason for Consult: End Stage Renal Disease            History of Present Illness: Mr. Richard Herring is a 59 y.o. white male with End Stage Renal Disease on hemodialysis via left arm AVF since 2007, atrial fibrillation with pacemaker, COPD with ongoing tobacco use, who was incarcerated until Saturday. Patient unfortunately was not accepted to a local outpatient dialysis center. He claims his last hemodialysis was Friday.  Patient was admitted to [facility] on 07/01/2015 for nausea/dialysis  Patient now complains of nausea/vomiting and peripheral edema. He states he cannot go on without a dialysis treatment.    Medications: Outpatient medications:  (Not in a hospital admission)  Current medications: Current Facility-Administered Medications  Medication Dose Route Frequency Provider Last Rate Last Dose  . nicotine (NICODERM CQ - dosed in mg/24 hours) patch 21 mg  21 mg Transdermal Once Harvest Dark, MD   21 mg at 07/01/15 1303  . ondansetron (ZOFRAN) injection 4 mg  4 mg Intravenous Once Harvest Dark, MD   4 mg at 07/01/15 1352  . ondansetron (ZOFRAN) injection 4 mg  4 mg Intravenous Q6H PRN Nicholes Mango, MD       Current Outpatient Prescriptions  Medication Sig Dispense Refill  . aspirin EC 81 MG tablet Take 81 mg by mouth daily.    Marland Kitchen b complex vitamins capsule Take 1 capsule by mouth daily.    . cyproheptadine (PERIACTIN) 4 MG tablet Take 4 mg by mouth 3 (three) times daily.    . folic acid (FOLVITE) 1 MG tablet Take 1 mg by mouth daily.    . furosemide (LASIX) 40 MG tablet Take 80 mg by mouth 2 (two) times daily.    Marland Kitchen lactulose (CHRONULAC) 10 GM/15ML solution Take 30 g by mouth 3 (three) times daily.     . metoprolol (LOPRESSOR) 50 MG tablet Take 50 mg by mouth 3 (three) times daily.    . ranitidine (ZANTAC) 300 MG tablet Take 300 mg by mouth 2 (two) times daily.    . sevelamer carbonate (RENVELA) 800 MG tablet Take 1,600 mg by mouth 3 (three) times daily with meals.    . traMADol (ULTRAM) 50 MG tablet Take 50 mg by mouth every 8 (eight) hours as needed.    . vitamin C (ASCORBIC ACID) 500 MG tablet Take 500 mg by mouth every 12 (twelve) hours.        Allergies: No Known Allergies    Past Medical History: Past Medical History  Diagnosis Date  . Renal disorder   . COPD (chronic obstructive pulmonary disease) ([place])   . A-fib [facility])      Past Surgical History: History reviewed. No pertinent past surgical history.   Family History: History reviewed. No pertinent family history.   Social History: Social History   Social History  . Marital Status: Single    Spouse Name: N/A  . Number of Children: N/A  .  Years of Education: N/A   Occupational History  . Not on file.   Social History Main Topics  . Smoking status: Current Every Day Smoker  . Smokeless tobacco: Not on file  . Alcohol Use: No  . Drug Use: Not on file  . Sexual Activity: Not on file   Other Topics Concern  . Not on file   Social History Narrative     Review of Systems: Review of Systems  Constitutional: Positive for malaise/fatigue. Negative for fever, chills, weight loss and diaphoresis.  HENT: Negative.  Negative for congestion, ear discharge, ear pain, hearing loss, nosebleeds, sore throat and tinnitus.   Eyes: Negative.  Negative for blurred vision, double vision, photophobia, pain, discharge and redness.  Respiratory: Positive for shortness of breath and wheezing. Negative for cough, hemoptysis, sputum production and stridor.   Cardiovascular: Positive for leg swelling. Negative for chest pain, palpitations, orthopnea, claudication and PND.  Gastrointestinal: Positive for heartburn, nausea,  vomiting and diarrhea. Negative for abdominal pain, constipation, blood in stool and melena.  Genitourinary: Negative.  Negative for dysuria, urgency, frequency, hematuria and flank pain.  Musculoskeletal: Negative.  Negative for myalgias, back pain, joint pain, falls and neck pain.  Skin: Negative.  Negative for itching and rash.  Neurological: Positive for weakness. Negative for dizziness, tingling, tremors, sensory change, speech change, focal weakness, seizures, loss of consciousness and headaches.  Endo/Heme/Allergies: Negative.  Negative for environmental allergies and polydipsia. Does not bruise/bleed easily.  Psychiatric/Behavioral: Negative.  Negative for depression, suicidal ideas, hallucinations, memory loss and substance abuse. The patient is not nervous/anxious and does not have insomnia.     Vital Signs: Blood pressure 113/83, pulse 85, temperature 98.3 F (36.8 C), temperature source Oral, resp. rate 18, height '5\' 11"'$  (1.803 m), weight 81.647 kg (180 lb), SpO2 100 %.  Weight trends: Filed Weights   07/01/15 0957  Weight: 81.647 kg (180 lb)    Physical Exam: General: NAD, sitting on stretcher  Head: Normocephalic, atraumatic. Moist oral mucosal membranes  Eyes: Anicteric, PERRL  Neck: Supple, trachea midline  Lungs:  Clear to auscultation  Heart: Regular rate and rhythm  Abdomen:  Soft, nontender,   Extremities: 2+ peripheral edema.  Neurologic: Nonfocal, moving all four extremities  Skin: No lesions  Access: Left arm AVF     Lab results: Basic Metabolic Panel:  Recent Labs Lab 06/29/15 0923 06/30/15 0941 07/01/15 1000  NA 139 141 142  K 3.9 3.8 4.1  CL 104 107 112*  CO2 20* 22 19*  GLUCOSE 89 88 98  BUN 57* 65* 70*  CREATININE 8.47* 9.34* 10.09*  CALCIUM 8.9 8.8* 8.9    Liver Function Tests:  Recent Labs Lab 06/30/15 0941 07/01/15 1000  AST 67* 57*  ALT 68* 65*  ALKPHOS 53 53  BILITOT 0.8 0.5  PROT 7.0 7.2  ALBUMIN 3.4* 3.6   No results  for input(s): LIPASE, AMYLASE in the last 168 hours. No results for input(s): AMMONIA in the last 168 hours.  CBC:  Recent Labs Lab 06/29/15 0923 06/30/15 0941 07/01/15 1000  WBC 3.7* 3.3* 5.3  NEUTROABS 2.3 2.1  --   HGB 11.7* 12.4* 12.5*  HCT 34.3* 36.9* 37.0*  MCV 98.2 99.3 98.5  PLT 55* 54* 61*    Cardiac Enzymes: No results for input(s): CKTOTAL, CKMB, CKMBINDEX, TROPONINI in the last 168 hours.  BNP: Invalid input(s): POCBNP  CBG: No results for input(s): GLUCAP in the last 168 hours.  Microbiology: No results found for this or  any previous visit.  Coagulation Studies: No results for input(s): LABPROT, INR in the last 72 hours.  Urinalysis: No results for input(s): COLORURINE, LABSPEC, PHURINE, GLUCOSEU, HGBUR, BILIRUBINUR, KETONESUR, PROTEINUR, UROBILINOGEN, NITRITE, LEUKOCYTESUR in the last 72 hours.  Invalid input(s): APPERANCEUR    Imaging: Dg Chest 2 View  06/30/2015  CLINICAL DATA:  Shortness of breath, renal failure, end-stage renal disease on dialysis EXAM: CHEST  2 VIEW COMPARISON:  None FINDINGS: Loop recorder projects over anterior LEFT chest. Normal heart size, mediastinal contours and pulmonary vascularity. Questionable density at the LEFT upper lobe may be related to the anterior LEFT first costochondral junction but a LEFT upper lobe nodule is not excluded, 2.4 x 1.3 cm in size. Underlying emphysematous and bronchitic changes. Minimal bibasilar atelectasis. No acute infiltrate, pleural effusion or pneumothorax. Surgical absence versus resorption of distal RIGHT clavicle. No acute osseous findings. IMPRESSION: Emphysematous and bronchitic changes consistent with COPD. Question small nodular density LEFT apex versus sclerosis at the LEFT first costochondral junction; if patient has prior outside exams to the should be obtained for comparison to establish stability. In the absence of prior exams, CT chest without contrast recommended to exclude LEFT upper  lobe pulmonary nodule. Electronically Signed   By: Lavonia Dana M.D.   On: 06/30/2015 11:11   Ct Chest Wo Contrast  06/30/2015  CLINICAL DATA:  Abnormal chest x-ray EXAM: CT CHEST WITHOUT CONTRAST TECHNIQUE: Multidetector CT imaging of the chest was performed following the standard protocol without IV contrast. COMPARISON:  Chest x-ray 06/30/2015 FINDINGS: Mediastinum/Lymph Nodes: Images of the thoracic inlet are unremarkable. Central airways are patent. Atherosclerotic calcifications of thoracic aorta and coronary arteries. No mediastinal hematoma or adenopathy. No hilar adenopathy is noted on this unenhanced scan. Heart size within normal limits. No pericardial effusion. Small hiatal hernia. Mild thickening of distal esophageal wall. Gastroesophageal reflux disease cannot be excluded. Clinical correlation is necessary. Lungs/Pleura: There is no pleural thickening of pleural effusion. Mild emphysematous changes are noted bilateral upper lobes. No infiltrate or pulmonary edema. As noted on chest x-ray there is elongated somewhat spiculated nodular consolidation in left upper lobe measures at least 2.1 cm by 1.5 cm. This is highly suspicious for malignancy. Further correlation with PET scan and/or biopsy is recommended. Upper abdomen: There is subtle micro nodular liver contour. Small perihepatic ascites. Chronic liver disease or early cirrhosis cannot be excluded. High density material noted within contracted gallbladder. This may be due to inspissated sludge or multiple small calcified gallstones. There is thickening of gallbladder wall up to 6 mm. Further correlation with gallbladder ultrasound could be performed as clinically warranted. The spleen measures 13.8 cm in length. Unenhanced pancreas is unremarkable. Portal vein measures 1.4 cm in diameter. Portal hypertension cannot be excluded. Bilateral atrophic kidneys with cortical thinning. Musculoskeletal: Sagittal images of the spine shows mild degenerative  changes thoracolumbar spine. No destructive bony lesions are noted. Sagittal view of the sternum is unremarkable. IMPRESSION: 1. There is elongated spiculated nodular lesion in left upper lobe measures at least 2.1 x 1.5 cm. This is highly suspicious for malignancy. Further evaluation with PET scan and/or biopsy is recommended. 2. Mild emphysematous changes bilateral upper lobes. No acute infiltrate or pulmonary edema. 3. Small hiatal hernia. There is mild thickening of distal esophageal wall. Gastroesophageal reflux or esophagitis cannot be excluded. Clinical correlation is necessary. 4. Mild splenomegaly. Subtle micro nodular liver contour and small perihepatic ascites. Early cirrhosis or chronic hepatic ascites cannot be excluded. Clinical correlation is necessary. Prominent size portal  vein. Portal hypertension cannot be excluded. 5. There is thickening of gallbladder wall. The gallbladder is contracted. High-density material within gallbladder may represent sludge or multiple small calcified gallstones. Further correlation with gallbladder ultrasound is recommended. 6. Bilateral atrophic kidneys with cortical thinning. These results were called by telephone at the time of interpretation on 06/30/2015 at 1:47 pm to Dr. Marta Antu , who verbally acknowledged these results. Electronically Signed   By: Lahoma Crocker M.D.   On: 06/30/2015 13:48      Assessment & Plan: Mr. Aquarius Latouche is a 60 y.o. white male with End Stage Renal Disease on hemodialysis via left arm AVF since 2007, atrial fibrillation with pacemaker, COPD with ongoing tobacco use, who was incarcerated until Saturday. Patient unfortunately was not accepted to a local outpatient dialysis center.  1. End Stage Renal Disease: schedule patient for dialysis today. Orders prepared. Discussed patient getting dialysis at the hospital until he shows compliance enough to be admitted to an outpatient dialysis center.   2. Hypertension: blood pressure at  goal.   3. Secondary Hyperparathyroidism: - Check PTH, calcium, and phosphorus  4. Anemia of chronic kidney disease: hemoglobin at goal. No indication for epo.   LOS:  Lavonia Dana 5/10/20174:04 PM

## 2015-07-01 NOTE — Progress Notes (Signed)
Per Dr. Margaretmary Eddy okay to place pt on a renal diet with 1572m fluid restriction. Will keep pt on enteric precaution, and put in for sample of C Diff.

## 2015-07-01 NOTE — H&P (Signed)
[facility] [place] NAME: Richard Herring    MR#:  [id number]  DATE OF BIRTH:  02/08/1956  DATE OF ADMISSION:  07/01/2015  PRIMARY CARE PHYSICIAN: Pcp Not In System   REQUESTING/REFERRING PHYSICIAN: Harvest Dark, MD  CHIEF COMPLAINT:  Nausea vomiting and diarrhea  HISTORY OF PRESENT ILLNESS:  Richard Herring  is a 60 y.o. male with a known history of End-stage renal disease, on hemodialysis on Monday, Wednesday and Friday, missed his last dialysis on Monday is presenting to the ED with a chief complaint of nausea, vomiting and diarrhea associated with diffuse abdominal pain. Denies any fever or sick contacts. As reported by the patient he [facility] and unable to get his hemodialysis.[facility] get [facility]yzed as an outpatient but they have not been able to do so, have not found a bed for him. Reporting swollen legs  PAST MEDICAL HISTORY:   Past Medical History  Diagnosis Date  . Renal disorder   . COPD (chronic obstructive pulmonary disease) ([place])   . A-fib [facility])   End-stage renal disease on hemodialysis on Monday, Wednesday and Friday  PAST SURGICAL HISTOIRY:  Hemodialysis catheter  SOCIAL HISTORY:   Social History  Substance Use Topics  . Smoking status: Current Every Day Smoker  . Smokeless tobacco: Not on file  . Alcohol Use: No    FAMILY HISTORY:  HTN  DRUG ALLERGIES:  No Known Allergies  REVIEW OF SYSTEMS:  CONSTITUTIONAL: No fever, fatigue or weakness.  EYES: No blurred or double vision.  EARS, NOSE, AND THROAT: No tinnitus or ear pain.  RESPIRATORY: No cough, shortness of breath, wheezing or hemoptysis.  CARDIOVASCULAR: No chest pain, orthopnea, edema.  GASTROINTESTINAL: Reporting nausea, vomiting, diarrhea and gen  abdominal pain.  GENITOURINARY: No dysuria, hematuria.  ENDOCRINE: No polyuria, nocturia,  HEMATOLOGY: No  anemia, easy bruising or bleeding SKIN: No rash or lesion. MUSCULOSKELETAL: No joint pain or arthritis.   NEUROLOGIC: No tingling, numbness, weakness.  PSYCHIATRY: No anxiety or depression.   MEDICATIONS AT HOME:   Prior to Admission medications   Medication Sig Start Date End Date Taking? Authorizing Provider  aspirin EC 81 MG tablet Take 81 mg by mouth daily.   Yes Historical Provider, MD  b complex vitamins capsule Take 1 capsule by mouth daily.   Yes Historical Provider, MD  cyproheptadine (PERIACTIN) 4 MG tablet Take 4 mg by mouth 3 (three) times daily.   Yes Historical Provider, MD  folic acid (FOLVITE) 1 MG tablet Take 1 mg by mouth daily.   Yes Historical Provider, MD  furosemide (LASIX) 40 MG tablet Take 80 mg by mouth 2 (two) times daily.   Yes Historical Provider, MD  lactulose (CHRONULAC) 10 GM/15ML solution Take 30 g by mouth 3 (three) times daily.   Yes Historical Provider, MD  metoprolol (LOPRESSOR) 50 MG tablet Take 50 mg by mouth 3 (three) times daily.   Yes Historical Provider, MD  ranitidine (ZANTAC) 300 MG tablet Take 300 mg by mouth 2 (two) times daily.   Yes Historical Provider, MD  sevelamer carbonate (RENVELA) 800 MG tablet Take 1,600 mg by mouth 3 (three) times daily with meals.   Yes Historical Provider, MD  traMADol (ULTRAM) 50 MG tablet Take 50 mg by mouth every 8 (eight) hours as needed.   Yes Historical Provider, MD  vitamin C (ASCORBIC ACID) 500 MG tablet Take  500 mg by mouth every 12 (twelve) hours.   Yes Historical Provider, MD      VITAL SIGNS:  Blood pressure 113/83, pulse 85, temperature 98.3 F (36.8 C), temperature source Oral, resp. rate 18, height '5\' 11"'$  (1.803 m), weight 81.647 kg (180 lb), SpO2 100 %.  PHYSICAL EXAMINATION:  GENERAL:  60 y.o.-year-old patient lying in the bed with no acute distress.  EYES: Pupils equal, round, reactive to light and accommodation. No scleral icterus. Extraocular muscles intact.  HEENT: Head atraumatic,  normocephalic. Oropharynx and nasopharynx clear.  NECK:  Supple, no jugular venous distention. No thyroid enlargement, no tenderness.  LUNGS: Normal breath sounds bilaterally, no wheezing, rales,rhonchi or crepitation. No use of accessory muscles of respiration.  CARDIOVASCULAR: S1, S2 normal. No murmurs, rubs, or gallops.  ABDOMEN: Soft, Diffusely tender, no rebound tenderness nondistended. Bowel sounds present. No mass.  EXTREMITIES: No pedal edema, cyanosis, or clubbing.  NEUROLOGIC: Cranial nerves II through XII are intact. Muscle strength 5/5 in all extremities. Sensation intact. Gait not checked.  PSYCHIATRIC: The patient is alert and oriented x 3.  SKIN: No obvious rash, lesion, or ulcer.   LABORATORY PANEL:   CBC  Recent Labs Lab 07/01/15 1000  WBC 5.3  HGB 12.5*  HCT 37.0*  PLT 61*   ------------------------------------------------------------------------------------------------------------------  Chemistries   Recent Labs Lab 07/01/15 1000  NA 142  K 4.1  CL 112*  CO2 19*  GLUCOSE 98  BUN 70*  CREATININE 10.09*  CALCIUM 8.9  AST 57*  ALT 65*  ALKPHOS 53  BILITOT 0.5   ------------------------------------------------------------------------------------------------------------------  Cardiac Enzymes No results for input(s): TROPONINI in the last 168 hours. ------------------------------------------------------------------------------------------------------------------  RADIOLOGY:  Dg Chest 2 View  06/30/2015  CLINICAL DATA:  Shortness of breath, renal failure, end-stage renal disease on dialysis EXAM: CHEST  2 VIEW COMPARISON:  None FINDINGS: Loop recorder projects over anterior LEFT chest. Normal heart size, mediastinal contours and pulmonary vascularity. Questionable density at the LEFT upper lobe may be related to the anterior LEFT first costochondral junction but a LEFT upper lobe nodule is not excluded, 2.4 x 1.3 cm in size. Underlying emphysematous and  bronchitic changes. Minimal bibasilar atelectasis. No acute infiltrate, pleural effusion or pneumothorax. Surgical absence versus resorption of distal RIGHT clavicle. No acute osseous findings. IMPRESSION: Emphysematous and bronchitic changes consistent with COPD. Question small nodular density LEFT apex versus sclerosis at the LEFT first costochondral junction; if patient has prior outside exams to the should be obtained for comparison to establish stability. In the absence of prior exams, CT chest without contrast recommended to exclude LEFT upper lobe pulmonary nodule. Electronically Signed   By: Lavonia Dana M.D.   On: 06/30/2015 11:11   Ct Chest Wo Contrast  06/30/2015  CLINICAL DATA:  Abnormal chest x-ray EXAM: CT CHEST WITHOUT CONTRAST TECHNIQUE: Multidetector CT imaging of the chest was performed following the standard protocol without IV contrast. COMPARISON:  Chest x-ray 06/30/2015 FINDINGS: Mediastinum/Lymph Nodes: Images of the thoracic inlet are unremarkable. Central airways are patent. Atherosclerotic calcifications of thoracic aorta and coronary arteries. No mediastinal hematoma or adenopathy. No hilar adenopathy is noted on this unenhanced scan. Heart size within normal limits. No pericardial effusion. Small hiatal hernia. Mild thickening of distal esophageal wall. Gastroesophageal reflux disease cannot be excluded. Clinical correlation is necessary. Lungs/Pleura: There is no pleural thickening of pleural effusion. Mild emphysematous changes are noted bilateral upper lobes. No infiltrate or pulmonary edema. As noted on chest x-ray there is elongated somewhat spiculated nodular consolidation  in left upper lobe measures at least 2.1 cm by 1.5 cm. This is highly suspicious for malignancy. Further correlation with PET scan and/or biopsy is recommended. Upper abdomen: There is subtle micro nodular liver contour. Small perihepatic ascites. Chronic liver disease or early cirrhosis cannot be excluded. High  density material noted within contracted gallbladder. This may be due to inspissated sludge or multiple small calcified gallstones. There is thickening of gallbladder wall up to 6 mm. Further correlation with gallbladder ultrasound could be performed as clinically warranted. The spleen measures 13.8 cm in length. Unenhanced pancreas is unremarkable. Portal vein measures 1.4 cm in diameter. Portal hypertension cannot be excluded. Bilateral atrophic kidneys with cortical thinning. Musculoskeletal: Sagittal images of the spine shows mild degenerative changes thoracolumbar spine. No destructive bony lesions are noted. Sagittal view of the sternum is unremarkable. IMPRESSION: 1. There is elongated spiculated nodular lesion in left upper lobe measures at least 2.1 x 1.5 cm. This is highly suspicious for malignancy. Further evaluation with PET scan and/or biopsy is recommended. 2. Mild emphysematous changes bilateral upper lobes. No acute infiltrate or pulmonary edema. 3. Small hiatal hernia. There is mild thickening of distal esophageal wall. Gastroesophageal reflux or esophagitis cannot be excluded. Clinical correlation is necessary. 4. Mild splenomegaly. Subtle micro nodular liver contour and small perihepatic ascites. Early cirrhosis or chronic hepatic ascites cannot be excluded. Clinical correlation is necessary. Prominent size portal vein. Portal hypertension cannot be excluded. 5. There is thickening of gallbladder wall. The gallbladder is contracted. High-density material within gallbladder may represent sludge or multiple small calcified gallstones. Further correlation with gallbladder ultrasound is recommended. 6. Bilateral atrophic kidneys with cortical thinning. These results were called by telephone at the time of interpretation on 06/30/2015 at 1:47 pm to Dr. Marta Antu , who verbally acknowledged these results. Electronically Signed   By: Lahoma Crocker M.D.   On: 06/30/2015 13:48    EKG:  No orders found  for this or any previous visit.  IMPRESSION AND PLAN:     #Acute gastroenteritis-probably viral Provide clear liquid diet Patient is a dialysis patient, anuric for the past 6 months Check stool for occult blood and get stool culture  Antiemetics prn Pain meds when necessary  #End stage renal disease Missed his last hemodialysis as dialysis chair was not approved Continue hemodialysis on Monday Wednesday and Friday Currently patient is asymptomatic except for pedal edema ED physician has discussed with on-call nephrologist who is aware and planning to dialyze the patient today  #Thrombocytopenia-unclear etiology Discontinue home medication aspirin. Check hepatitis panel, CMP in a.m., Check serum alcohol level, patient denies drinking alcohol Patient is on lactulose is not quite sure why he is taking lactulose  #Chronic history of atrial fibrillation currently rate controlled Not on any anticoagulants other than baby aspirin, will hold baby aspirin in view of thrombocytopenia  #Chronic history of COPD currently with no exacerbation provide nebulizers as needed  #Tobacco abuse counseled patient to quit smoking for 3-5 minutes. We will provide nicotine patch  Consult case management regarding outpatient dialysis chair arrangements   All the records are reviewed and case discussed with ED provider. Management plans discussed with the patient, and he is  in agreement.  CODE STATUS: fc ,Discussed but no healthcare power of attorney  TOTAL TIME TAKING CARE OF THIS PATIENT: 45  minutes.    Nicholes Mango M.D on 07/01/2015 at 3:20 PM  Between 7am to 6pm - Pager - [phone]  After 6pm go to [email or web address] - password  [facility]  [facility]  Office  [phone]  CC: Primary care physician; Pcp Not In System

## 2015-07-01 NOTE — Progress Notes (Signed)
Per Dr. Vianne Bulls okay to place order for Tramadol PO '50mg'$  q 6 hours prn for pain

## 2015-07-01 NOTE — Progress Notes (Signed)
Tx started

## 2015-07-01 NOTE — Progress Notes (Signed)
PRE HD Assessment

## 2015-07-01 NOTE — Progress Notes (Signed)
HD Tx ended  

## 2015-07-01 NOTE — ED Provider Notes (Signed)
[facility] Emergency Department Provider Note  Time seen: 11:07 AM  I have reviewed the triage vital signs and the nursing notes.   HISTORY  Chief Complaint Nausea    HPI Richard Herring is a 60 y.o. male with a past medical history of end-stage renal disease on hemodialysis Monday, Wednesday, Friday, COPD, atrial fibrillation, who presents to the emergency department with nausea and requesting dialysis. According to report and the patient the [facility] which was his last day of dialysis. [facility] get [facility]yzed as an outpatient but they have not been able to do so, have not found a bed for him, so I told him to come to the emergency department for evaluation. Patient states for the past 2 days he has been very nauseated and vomiting. He states it feels like he needs a dialysis treatment. Denies fever or abdominal pain. Patient states he has not produced any urine for approximately 6 months.     Past Medical History  Diagnosis Date  . Renal disorder   . COPD (chronic obstructive pulmonary disease) ([place])   . A-fib ([place])     There are no active problems to display for this patient.   History reviewed. No pertinent past surgical history.  Current Outpatient Rx  Name  Route  Sig  Dispense  Refill  . aspirin EC 81 MG tablet   Oral   Take 81 mg by mouth daily.         Marland Kitchen b complex vitamins capsule   Oral   Take 1 capsule by mouth daily.         . cyproheptadine (PERIACTIN) 4 MG tablet   Oral   Take 4 mg by mouth 3 (three) times daily.         . folic acid (FOLVITE) 1 MG tablet   Oral   Take 1 mg by mouth daily.         . furosemide (LASIX) 40 MG tablet   Oral   Take 80 mg by mouth 2 (two) times daily.         Marland Kitchen lactulose (CHRONULAC) 10 GM/15ML solution   Oral   Take 30 g by mouth 3 (three) times daily.         . metoprolol (LOPRESSOR) 50 MG tablet  Oral   Take 50 mg by mouth 3 (three) times daily.         . ranitidine (ZANTAC) 300 MG tablet   Oral   Take 300 mg by mouth 2 (two) times daily.         . sevelamer carbonate (RENVELA) 800 MG tablet   Oral   Take 1,600 mg by mouth 3 (three) times daily with meals.         . traMADol (ULTRAM) 50 MG tablet   Oral   Take 50 mg by mouth every 8 (eight) hours as needed.         . vitamin C (ASCORBIC ACID) 500 MG tablet   Oral   Take 500 mg by mouth every 12 (twelve) hours.           Allergies Review of patient's allergies indicates no known allergies.  History reviewed. No pertinent family history.  Social History Social History  Substance Use Topics  . Smoking status: Current Every Day Smoker  . Smokeless tobacco: None  . Alcohol Use: No    Review of Systems Constitutional:  Negative for fever. Cardiovascular: Negative for chest pain. Respiratory: Negative for shortness of breath. Gastrointestinal: Negative for abdominal pain. Positive for nausea and vomiting. Neurological: Negative for headache 10-point ROS otherwise negative.  ____________________________________________   PHYSICAL EXAM:  VITAL SIGNS: ED Triage Vitals  Enc Vitals Group     BP 07/01/15 0957 111/66 mmHg     Pulse Rate 07/01/15 0957 61     Resp 07/01/15 0957 18     Temp 07/01/15 0957 98.3 F (36.8 C)     Temp Source 07/01/15 0957 Oral     SpO2 07/01/15 0957 99 %     Weight 07/01/15 0957 180 lb (81.647 kg)     Height 07/01/15 0957 '5\' 11"'$  (1.803 m)     Head Cir --      Peak Flow --      Pain Score 07/01/15 0958 10     Pain Loc --      Pain Edu? --      Excl. in [place]? --     Constitutional: Alert and oriented. Well appearing and in no distress. Eyes: Normal exam ENT   Head: Normocephalic and atraumatic.   Mouth/Throat: Mucous membranes are moist. Cardiovascular: Normal rate, regular rhythm. No murmur Respiratory: Normal respiratory effort without tachypnea nor retractions.  Breath sounds are clear  Gastrointestinal: Soft and nontender. No distention.  Musculoskeletal: Nontender with normal range of motion in all extremities. Left upper extremity fistula. Neurologic:  Normal speech and language. No gross focal neurologic deficits Skin:  Skin is warm, dry and intact.  Psychiatric: Mood and affect are normal. Speech and behavior are normal.   ____________________________________________      INITIAL IMPRESSION / ASSESSMENT AND PLAN / ED COURSE  Pertinent labs & imaging results that were available during my care of the patient were reviewed by me and considered in my medical decision making (see chart for details).  Patient presents the emergency department nausea, vomiting, has not received dialysis in 5 days. I discussed the patient with Dr.Koloru who will arrange for the patient to get dialysis today. The issue is the patient has been discharge from both of the outpatient dialysis centers in the [place], apparently due to behavioral issues. Social worker is working with the patient trying to find somewhere where the patient can receive dialysis, at this time they did not have a clear plan. Patient is nauseated, states he is vomiting in the emergency department bathroom. We'll treat with nausea medications and attempt to obtain dialysis for the patient.  Patient continues to be nauseated in the emergency department. I discussed with the hospitalist we'll admit to the hospital for nausea or vomiting, and for the patient to obtain dialysis when they tried to arrange a plan for outpatient dialysis for the patient going forward.  ____________________________________________   FINAL CLINICAL IMPRESSION(S) / ED DIAGNOSES  Nausea and vomiting    Harvest Dark, MD 07/01/15 1239

## 2015-07-01 NOTE — ED Notes (Signed)
Arrives with complaints of nausea and need for dialysis, was seen in Ed yesterday and told to return for blood work this AM, states last dialysis tx was 5/4, states lower ext swelling and weakness

## 2015-07-02 LAB — CBC
HEMATOCRIT: 31.2 % — AB (ref 40.0–52.0)
HEMOGLOBIN: 10.6 g/dL — AB (ref 13.0–18.0)
MCH: 33.3 pg (ref 26.0–34.0)
MCHC: 33.9 g/dL (ref 32.0–36.0)
MCV: 98.3 fL (ref 80.0–100.0)
Platelets: 45 10*3/uL — ABNORMAL LOW (ref 150–440)
RBC: 3.18 MIL/uL — ABNORMAL LOW (ref 4.40–5.90)
RDW: 15.2 % — ABNORMAL HIGH (ref 11.5–14.5)
WBC: 2.1 10*3/uL — AB (ref 3.8–10.6)

## 2015-07-02 LAB — HEPATITIS B SURFACE ANTIGEN: HEP B S AG: NEGATIVE

## 2015-07-02 LAB — HEPATITIS A ANTIBODY, IGM: Hep A IgM: NEGATIVE

## 2015-07-02 LAB — COMPREHENSIVE METABOLIC PANEL
ALK PHOS: 46 U/L (ref 38–126)
ALT: 51 U/L (ref 17–63)
AST: 40 U/L (ref 15–41)
Albumin: 2.9 g/dL — ABNORMAL LOW (ref 3.5–5.0)
Anion gap: 7 (ref 5–15)
BUN: 37 mg/dL — AB (ref 6–20)
CALCIUM: 8.1 mg/dL — AB (ref 8.9–10.3)
CHLORIDE: 105 mmol/L (ref 101–111)
CO2: 27 mmol/L (ref 22–32)
CREATININE: 6.82 mg/dL — AB (ref 0.61–1.24)
GFR, EST AFRICAN AMERICAN: 9 mL/min — AB (ref >60)
GFR, EST NON AFRICAN AMERICAN: 8 mL/min — AB (ref >60)
Glucose, Bld: 89 mg/dL (ref 65–99)
Potassium: 3.2 mmol/L — ABNORMAL LOW (ref 3.5–5.1)
Sodium: 139 mmol/L (ref 135–145)
Total Bilirubin: 0.8 mg/dL (ref 0.3–1.2)
Total Protein: 5.8 g/dL — ABNORMAL LOW (ref 6.5–8.1)

## 2015-07-02 LAB — HCV RNA QUANT
HCV QUANT: 5600000 [IU]/mL (ref >50)
HCV Quantitative Log: 6.748 log10 IU/mL (ref >1.70)

## 2015-07-02 LAB — AMMONIA: AMMONIA: 41 umol/L — AB (ref 9–35)

## 2015-07-02 LAB — PARATHYROID HORMONE, INTACT (NO CA): PTH: 327 pg/mL — AB (ref 15–65)

## 2015-07-02 LAB — HEPATITIS B SURFACE ANTIBODY,QUALITATIVE: Hep B S Ab: REACTIVE

## 2015-07-02 LAB — HEPATITIS B CORE ANTIBODY, TOTAL: Hep B Core Total Ab: POSITIVE — AB

## 2015-07-02 LAB — MRSA PCR SCREENING: MRSA BY PCR: POSITIVE — AB

## 2015-07-02 MED ORDER — HEPARIN SODIUM (PORCINE) 1000 UNIT/ML DIALYSIS: 3000.0000 [IU] | INTRAMUSCULAR | Status: DC | PRN

## 2015-07-02 MED ORDER — LIDOCAINE-PRILOCAINE 2.5-2.5 % EX CREA
TOPICAL_CREAM | Freq: Once | CUTANEOUS | Status: DC
  Filled 2015-07-02: qty 5

## 2015-07-02 NOTE — Care Management (Addendum)
Patient released from prison on Saturday. Patient has been receiving HD while in prision.  Patient was coming to the ED for labs and HDD treatments while outpatient treatment was arranged.  Patient was admitted overnight for obervation.  Spoke with Dr. Candiss Norse from [facility] who has accepted the patient.  Patient will go MWF will need to arrive tomorrow at 130.  CSW has spoke with group home and made them aware of arrangements.  CSW note to follow.  Iran Sizer updated.  RNCM signing off

## 2015-07-02 NOTE — Progress Notes (Signed)
The group home member Richard Herring, said they would be here to pick the patient up around 5pm and asked that we have him down at the entrance at that time.  The secretary took the patient down in a wheelchair but the ride was not there.  They waited for a while and still no one showed up.  We tried to get the patient to come back up and wait in the room but he refused.  He stated he was just going to sit down there and wait for the ride to come.

## 2015-07-02 NOTE — Progress Notes (Signed)
Post hd tx 

## 2015-07-02 NOTE — Progress Notes (Signed)
Pre HD Tx Machine & Patient Checks

## 2015-07-02 NOTE — Progress Notes (Signed)
Subjective:   Patient seen during dialysis Tolerating well     HEMODIALYSIS FLOWSHEET:  Blood Flow Rate (mL/min): 450 mL/min Arterial Pressure (mmHg): -230 mmHg Venous Pressure (mmHg): 250 mmHg Transmembrane Pressure (mmHg): 60 mmHg Ultrafiltration Rate (mL/min): 1430 mL/min Dialysate Flow Rate (mL/min): 800 ml/min Conductivity: Machine : 14 Conductivity: Machine : 14 Dialysis Fluid Bolus: Normal Saline Bolus Amount (mL): 250 mL (rinseback given) Dialysate Change:  (Pt requested BFR 450 and noted pressures within normal limit) Intra-Hemodialysis Comments: 3354m..Marland KitchenMarland Kitchendded labwork drawn.    Objective:  Vital signs in last 24 hours:  Temp:  [97.9 F (36.6 C)-98.7 F (37.1 C)] 98.3 F (36.8 C) (05/11 1200) Pulse Rate:  [61-111] 69 (05/11 1400) Resp:  [11-29] 14 (05/11 1400) BP: (86-163)/(58-77) 117/71 mmHg (05/11 1400) SpO2:  [97 %-100 %] 100 % (05/11 1400) Weight:  [80 kg (176 lb 5.9 oz)-84.5 kg (186 lb 4.6 oz)] 84.5 kg (186 lb 4.6 oz) (05/11 1200)  Weight change:  Filed Weights   07/01/15 1746 07/01/15 2054 07/02/15 1200  Weight: 81.6 kg (179 lb 14.3 oz) 80 kg (176 lb 5.9 oz) 84.5 kg (186 lb 4.6 oz)    Intake/Output:    Intake/Output Summary (Last 24 hours) at 07/02/15 1428 Last data filed at 07/02/15 0900  Gross per 24 hour  Intake    320 ml  Output   1700 ml  Net  -1380 ml     Physical Exam: General: No acute distress   HEENT Anicteric sclera   Neck Supple   Pulm/lungs Normal effort, clear to auscultation bilaterally   CVS/Heart Irregular, soft systolic murmur   Abdomen:  Soft, nontender, nondistended   Extremities: 2+ peripheral edema bilaterally   Neurologic: Alert, oriented   Skin: No acute rashes, multiple tattoos   Access: Left upper extremity AV fistula        Basic Metabolic Panel:   Recent Labs Lab 06/29/15 0923 06/30/15 0941 07/01/15 1000 07/01/15 1820 07/02/15 0444  NA 139 141 142  --  139  K 3.9 3.8 4.1  --  3.2*  CL 104 107  112*  --  105  CO2 20* 22 19*  --  27  GLUCOSE 89 88 98  --  89  BUN 57* 65* 70*  --  37*  CREATININE 8.47* 9.34* 10.09*  --  6.82*  CALCIUM 8.9 8.8* 8.9  --  8.1*  PHOS  --   --   --  4.0  --      CBC:  Recent Labs Lab 06/29/15 0923 06/30/15 0941 07/01/15 1000 07/02/15 0444  WBC 3.7* 3.3* 5.3 2.1*  NEUTROABS 2.3 2.1  --   --   HGB 11.7* 12.4* 12.5* 10.6*  HCT 34.3* 36.9* 37.0* 31.2*  MCV 98.2 99.3 98.5 98.3  PLT 55* 54* 61* 45*      Microbiology:  Recent Results (from the past 720 hour(s))  Gastrointestinal Panel by PCR , Stool     Status: None   Collection Time: 07/01/15  2:07 PM  Result Value Ref Range Status   Campylobacter species NOT DETECTED NOT DETECTED Final   Plesimonas shigelloides NOT DETECTED NOT DETECTED Final   Salmonella species NOT DETECTED NOT DETECTED Final   Yersinia enterocolitica NOT DETECTED NOT DETECTED Final   Vibrio species NOT DETECTED NOT DETECTED Final   Vibrio cholerae NOT DETECTED NOT DETECTED Final   Enteroaggregative E coli (EAEC) NOT DETECTED NOT DETECTED Final   Enteropathogenic E coli (EPEC) NOT DETECTED NOT DETECTED Final   Enterotoxigenic  E coli (ETEC) NOT DETECTED NOT DETECTED Final   Shiga like toxin producing E coli (STEC) NOT DETECTED NOT DETECTED Final   E. coli O157 NOT DETECTED NOT DETECTED Final   Shigella/Enteroinvasive E coli (EIEC) NOT DETECTED NOT DETECTED Final   Cryptosporidium NOT DETECTED NOT DETECTED Final   Cyclospora cayetanensis NOT DETECTED NOT DETECTED Final   Entamoeba histolytica NOT DETECTED NOT DETECTED Final   Giardia lamblia NOT DETECTED NOT DETECTED Final   Adenovirus F40/41 NOT DETECTED NOT DETECTED Final   Astrovirus NOT DETECTED NOT DETECTED Final   Norovirus GI/GII NOT DETECTED NOT DETECTED Final   Rotavirus A NOT DETECTED NOT DETECTED Final   Sapovirus (I, II, IV, and V) NOT DETECTED NOT DETECTED Final  MRSA PCR Screening     Status: Abnormal   Collection Time: 07/01/15  9:50 PM  Result  Value Ref Range Status   MRSA by PCR POSITIVE (A) NEGATIVE Final    Comment: CRITICAL RESULT CALLED TO, READ BACK BY AND VERIFIED WITH:  DAWN SONGSTER ON 07/02/15 AT 0044 BY TLB        The GeneXpert MRSA Assay (FDA approved for NASAL specimens only), is one component of a comprehensive MRSA colonization surveillance program. It is not intended to diagnose MRSA infection nor to guide or monitor treatment for MRSA infections.     Coagulation Studies: No results for input(s): LABPROT, INR in the last 72 hours.  Urinalysis: No results for input(s): COLORURINE, LABSPEC, PHURINE, GLUCOSEU, HGBUR, BILIRUBINUR, KETONESUR, PROTEINUR, UROBILINOGEN, NITRITE, LEUKOCYTESUR in the last 72 hours.  Invalid input(s): APPERANCEUR    Imaging: No results found.   Medications:     . lidocaine-prilocaine   Topical Once  . ondansetron (ZOFRAN) IV  4 mg Intravenous Once   heparin, ondansetron (ZOFRAN) IV, traMADol  Assessment/ Plan:  60 y.o. male with End Stage Renal Disease on hemodialysis via left arm AVF since 2007, atrial fibrillation with pacemaker, COPD with ongoing tobacco use, history of depression, who was incarcerated until Saturday.   1. End Stage Renal Disease:  - Dialysis today - We coordinated care between outpatient dialysis centers and care management to get the patient accepted in one of our local dialysis centers  2. Hypertension: blood pressure at goal.   3. Secondary Hyperparathyroidism: - Check PTH, calcium, and phosphorus  4. Anemia of chronic kidney disease: hemoglobin at goal.  - We'll start low-dose Epogen as outpatient  5. History of hepatitis C positive Repeat the test, check HIV also (verbal permission obtained from patient)  6. Disposition     LOS:   Tereka Thorley 5/11/20172:28 PM

## 2015-07-02 NOTE — Care Management Note (Signed)
Dr Candiss Norse has accepted patient at [facility] on MWF at 1:45 and needs to arrive for 1st treatment at center tomorrow at 1:30.  I meet with patient during dialysis today and he is very grateful for everyone that helped get him into a clinic.  Iran Sizer Dialysis Coordinator  [phone]

## 2015-07-02 NOTE — Progress Notes (Signed)
Pt to be discharged per MD order. IV removed. Instructions reviewed with group home and they have all the necessary information provided by [facility] from [place]. Group home will provide transportation back to group home.

## 2015-07-02 NOTE — Progress Notes (Signed)
Pre HD Tx Assessment

## 2015-07-02 NOTE — Progress Notes (Signed)
HD Tx Initiation

## 2015-07-02 NOTE — Progress Notes (Signed)
Hemodialysis completed. 

## 2015-07-02 NOTE — Clinical Social Work Note (Signed)
Patient recently in ED and is from [facility] home. Patient recently out of prison and his outpatient dialysis was not arranged and then was subsequently denied by [facility]. CSW has spoken to Dr. Candiss Norse this morning and he has looked at records and spoken to patient and patient now has a slot at [facility] on [address] at 1:30 on MWF. CSW contacted Lavada Mesi and informed her of this information and also informed her that Towanda Octave is requesting someone from the group home be with him while he signs the consent due to patient not being able to read or write. Tommie Sams stated that this could be done. She also stated patient did not need an Fl2 to return as he was observation status. She stated that Shanon Brow from her facility would transport him and number provided to nurse. Shela Leff MSW,LCSW [phone]

## 2015-07-02 NOTE — Discharge Summary (Signed)
Richard Herring, 60 y.o., DOB Aug 09, 1955, MRN [id number]. Admission date: 07/01/2015 Discharge Date 07/02/2015 Primary MD Pcp Not In System Admitting Physician Nicholes Mango, MD  Admission Diagnosis  Dialysis patient [facility]) [Z99.2] Non-intractable vomiting with nausea, vomiting of unspecified type [R11.2]  Discharge Diagnosis   Active Problems:   Acute gastroenteritis resolved   End-stage renal disease COPD Atrial fibrillation       Hospital Course Richard Herring is a 60 y.o. male with a known history of End-stage renal disease, on hemodialysis on Monday, Wednesday and Friday, missed his last dialysis on Monday is presenting to the ED with a chief complaint of nausea, vomiting and diarrhea associated with diffuse abdominal pain. Patient has had some issues with hemodialysis but was seen by nephrology he'll get dialysis today and they have been worked out. He is stable for discharge            Consults  nephrology  Significant Tests:  See full reports for all details      Dg Chest 2 View  06/30/2015  CLINICAL DATA:  Shortness of breath, renal failure, end-stage renal disease on dialysis EXAM: CHEST  2 VIEW COMPARISON:  None FINDINGS: Loop recorder projects over anterior LEFT chest. Normal heart size, mediastinal contours and pulmonary vascularity. Questionable density at the LEFT upper lobe may be related to the anterior LEFT first costochondral junction but a LEFT upper lobe nodule is not excluded, 2.4 x 1.3 cm in size. Underlying emphysematous and bronchitic changes. Minimal bibasilar atelectasis. No acute infiltrate, pleural effusion or pneumothorax. Surgical absence versus resorption of distal RIGHT clavicle. No acute osseous findings. IMPRESSION: Emphysematous and bronchitic changes consistent with COPD. Question small nodular density LEFT apex versus sclerosis at the LEFT first costochondral junction; if patient has prior outside exams to the should be obtained for comparison to  establish stability. In the absence of prior exams, CT chest without contrast recommended to exclude LEFT upper lobe pulmonary nodule. Electronically Signed   By: Lavonia Dana M.D.   On: 06/30/2015 11:11   Ct Chest Wo Contrast  06/30/2015  CLINICAL DATA:  Abnormal chest x-ray EXAM: CT CHEST WITHOUT CONTRAST TECHNIQUE: Multidetector CT imaging of the chest was performed following the standard protocol without IV contrast. COMPARISON:  Chest x-ray 06/30/2015 FINDINGS: Mediastinum/Lymph Nodes: Images of the thoracic inlet are unremarkable. Central airways are patent. Atherosclerotic calcifications of thoracic aorta and coronary arteries. No mediastinal hematoma or adenopathy. No hilar adenopathy is noted on this unenhanced scan. Heart size within normal limits. No pericardial effusion. Small hiatal hernia. Mild thickening of distal esophageal wall. Gastroesophageal reflux disease cannot be excluded. Clinical correlation is necessary. Lungs/Pleura: There is no pleural thickening of pleural effusion. Mild emphysematous changes are noted bilateral upper lobes. No infiltrate or pulmonary edema. As noted on chest x-ray there is elongated somewhat spiculated nodular consolidation in left upper lobe measures at least 2.1 cm by 1.5 cm. This is highly suspicious for malignancy. Further correlation with PET scan and/or biopsy is recommended. Upper abdomen: There is subtle micro nodular liver contour. Small perihepatic ascites. Chronic liver disease or early cirrhosis cannot be excluded. High density material noted within contracted gallbladder. This may be due to inspissated sludge or multiple small calcified gallstones. There is thickening of gallbladder wall up to 6 mm. Further correlation with gallbladder ultrasound could be performed as clinically warranted. The spleen measures 13.8 cm in length. Unenhanced pancreas is unremarkable. Portal vein measures 1.4 cm in diameter. Portal hypertension cannot be excluded. Bilateral  atrophic kidneys  with cortical thinning. Musculoskeletal: Sagittal images of the spine shows mild degenerative changes thoracolumbar spine. No destructive bony lesions are noted. Sagittal view of the sternum is unremarkable. IMPRESSION: 1. There is elongated spiculated nodular lesion in left upper lobe measures at least 2.1 x 1.5 cm. This is highly suspicious for malignancy. Further evaluation with PET scan and/or biopsy is recommended. 2. Mild emphysematous changes bilateral upper lobes. No acute infiltrate or pulmonary edema. 3. Small hiatal hernia. There is mild thickening of distal esophageal wall. Gastroesophageal reflux or esophagitis cannot be excluded. Clinical correlation is necessary. 4. Mild splenomegaly. Subtle micro nodular liver contour and small perihepatic ascites. Early cirrhosis or chronic hepatic ascites cannot be excluded. Clinical correlation is necessary. Prominent size portal vein. Portal hypertension cannot be excluded. 5. There is thickening of gallbladder wall. The gallbladder is contracted. High-density material within gallbladder may represent sludge or multiple small calcified gallstones. Further correlation with gallbladder ultrasound is recommended. 6. Bilateral atrophic kidneys with cortical thinning. These results were called by telephone at the time of interpretation on 06/30/2015 at 1:47 pm to Dr. Marta Antu , who verbally acknowledged these results. Electronically Signed   By: Lahoma Crocker M.D.   On: 06/30/2015 13:48       Today   Subjective:   Richard Herring  Feels well as any complaints asking when he can go home  Objective:   Blood pressure 117/68, pulse 65, temperature 98 F (36.7 C), temperature source Oral, resp. rate 18, height '5\' 11"'$  (1.803 m), weight 80 kg (176 lb 5.9 oz), SpO2 99 %.  .  Intake/Output Summary (Last 24 hours) at 07/02/15 1221 Last data filed at 07/02/15 0900  Gross per 24 hour  Intake    320 ml  Output   1700 ml  Net  -1380 ml     Exam VITAL SIGNS: Blood pressure 117/68, pulse 65, temperature 98 F (36.7 C), temperature source Oral, resp. rate 18, height '5\' 11"'$  (1.803 m), weight 80 kg (176 lb 5.9 oz), SpO2 99 %.  GENERAL:  60 y.o.-year-old patient lying in the bed with no acute distress.  EYES: Pupils equal, round, reactive to light and accommodation. No scleral icterus. Extraocular muscles intact.  HEENT: Head atraumatic, normocephalic. Oropharynx and nasopharynx clear.  NECK:  Supple, no jugular venous distention. No thyroid enlargement, no tenderness.  LUNGS: Normal breath sounds bilaterally, no wheezing, rales,rhonchi or crepitation. No use of accessory muscles of respiration.  CARDIOVASCULAR: S1, S2 normal. No murmurs, rubs, or gallops.  ABDOMEN: Soft, nontender, nondistended. Bowel sounds present. No organomegaly or mass.  EXTREMITIES: No pedal edema, cyanosis, or clubbing.  NEUROLOGIC: Cranial nerves II through XII are intact. Muscle strength 5/5 in all extremities. Sensation intact. Gait not checked.  PSYCHIATRIC: The patient is alert and oriented x 3.  SKIN: No obvious rash, lesion, or ulcer.   Data Review     CBC w Diff: Lab Results  Component Value Date   WBC 2.1* 07/02/2015   HGB 10.6* 07/02/2015   HCT 31.2* 07/02/2015   PLT 45* 07/02/2015   LYMPHOPCT 20% 06/30/2015   MONOPCT 6% 06/30/2015   EOSPCT 8% 06/30/2015   BASOPCT 1% 06/30/2015   CMP: Lab Results  Component Value Date   NA 139 07/02/2015   K 3.2* 07/02/2015   CL 105 07/02/2015   CO2 27 07/02/2015   BUN 37* 07/02/2015   CREATININE 6.82* 07/02/2015   PROT 5.8* 07/02/2015   ALBUMIN 2.9* 07/02/2015   BILITOT 0.8 07/02/2015   ALKPHOS  46 07/02/2015   AST 40 07/02/2015   ALT 51 07/02/2015  .  Micro Results Recent Results (from the past 240 hour(s))  Gastrointestinal Panel by PCR , Stool     Status: None   Collection Time: 07/01/15  2:07 PM  Result Value Ref Range Status   Campylobacter species NOT DETECTED NOT DETECTED  Final   Plesimonas shigelloides NOT DETECTED NOT DETECTED Final   Salmonella species NOT DETECTED NOT DETECTED Final   Yersinia enterocolitica NOT DETECTED NOT DETECTED Final   Vibrio species NOT DETECTED NOT DETECTED Final   Vibrio cholerae NOT DETECTED NOT DETECTED Final   Enteroaggregative E coli (EAEC) NOT DETECTED NOT DETECTED Final   Enteropathogenic E coli (EPEC) NOT DETECTED NOT DETECTED Final   Enterotoxigenic E coli (ETEC) NOT DETECTED NOT DETECTED Final   Shiga like toxin producing E coli (STEC) NOT DETECTED NOT DETECTED Final   E. coli O157 NOT DETECTED NOT DETECTED Final   Shigella/Enteroinvasive E coli (EIEC) NOT DETECTED NOT DETECTED Final   Cryptosporidium NOT DETECTED NOT DETECTED Final   Cyclospora cayetanensis NOT DETECTED NOT DETECTED Final   Entamoeba histolytica NOT DETECTED NOT DETECTED Final   Giardia lamblia NOT DETECTED NOT DETECTED Final   Adenovirus F40/41 NOT DETECTED NOT DETECTED Final   Astrovirus NOT DETECTED NOT DETECTED Final   Norovirus GI/GII NOT DETECTED NOT DETECTED Final   Rotavirus A NOT DETECTED NOT DETECTED Final   Sapovirus (I, II, IV, and V) NOT DETECTED NOT DETECTED Final  MRSA PCR Screening     Status: Abnormal   Collection Time: 07/01/15  9:50 PM  Result Value Ref Range Status   MRSA by PCR POSITIVE (A) NEGATIVE Final    Comment: CRITICAL RESULT CALLED TO, READ BACK BY AND VERIFIED WITH:  DAWN SONGSTER ON 07/02/15 AT 0044 BY TLB        The GeneXpert MRSA Assay (FDA approved for NASAL specimens only), is one component of a comprehensive MRSA colonization surveillance program. It is not intended to diagnose MRSA infection nor to guide or monitor treatment for MRSA infections.         Code Status Orders        Start     Ordered   07/01/15 1702  Full code   Continuous     07/01/15 1701    Code Status History    Date Active Date Inactive Code Status Order ID Comments User Context   This patient has a current code status but  no historical code status.          Follow-up Information    Follow up with dr. Orpha Bur In 7 days.      Discharge Medications     Medication List    TAKE these medications        aspirin EC 81 MG tablet  Take 81 mg by mouth daily.     b complex vitamins capsule  Take 1 capsule by mouth daily.     cyproheptadine 4 MG tablet  Commonly known as:  PERIACTIN  Take 4 mg by mouth 3 (three) times daily.     folic acid 1 MG tablet  Commonly known as:  FOLVITE  Take 1 mg by mouth daily.     furosemide 40 MG tablet  Commonly known as:  LASIX  Take 80 mg by mouth 2 (two) times daily.     lactulose 10 GM/15ML solution  Commonly known as:  CHRONULAC  Take 30 g by mouth 3 (three) times  daily.     metoprolol 50 MG tablet  Commonly known as:  LOPRESSOR  Take 50 mg by mouth 3 (three) times daily.     ranitidine 300 MG tablet  Commonly known as:  ZANTAC  Take 300 mg by mouth 2 (two) times daily.     sevelamer carbonate 800 MG tablet  Commonly known as:  RENVELA  Take 1,600 mg by mouth 3 (three) times daily with meals.     traMADol 50 MG tablet  Commonly known as:  ULTRAM  Take 50 mg by mouth every 8 (eight) hours as needed.     vitamin C 500 MG tablet  Commonly known as:  ASCORBIC ACID  Take 500 mg by mouth every 12 (twelve) hours.           Total Time in preparing paper work, data evaluation and todays exam - 35 minutes  Dustin Flock M.D on 07/02/2015 at 12:21 PM  [facility]   Office  [phone]

## 2015-07-02 NOTE — Discharge Instructions (Signed)
°  DIET:  Renal diet  DISCHARGE CONDITION:  Stable  ACTIVITY:  Activity as tolerated  OXYGEN:  Home Oxygen: No.   Oxygen Delivery: room air  DISCHARGE LOCATION:  home    ADDITIONAL DISCHARGE INSTRUCTION:   If you experience worsening of your admission symptoms, develop shortness of breath, life threatening emergency, suicidal or homicidal thoughts you must seek medical attention immediately by calling 911 or calling your MD immediately  if symptoms less severe.  You Must read complete instructions/literature along with all the possible adverse reactions/side effects for all the Medicines you take and that have been prescribed to you. Take any new Medicines after you have completely understood and accpet all the possible adverse reactions/side effects.   Please note  You were cared for by a hospitalist during your hospital stay. If you have any questions about your discharge medications or the care you received while you were in the hospital after you are discharged, you can call the unit and asked to speak with the hospitalist on call if the hospitalist that took care of you is not available. Once you are discharged, your primary care physician will handle any further medical issues. Please note that NO REFILLS for any discharge medications will be authorized once you are discharged, as it is imperative that you return to your primary care physician (or establish a relationship with a primary care physician if you do not have one) for your aftercare needs so that they can reassess your need for medications and monitor your lab values.

## 2015-07-03 LAB — HEPATITIS C ANTIBODY: HCV Ab: >11 s/co ratio — ABNORMAL HIGH (ref 0.0–0.9)

## 2015-07-03 LAB — HIV ANTIBODY (ROUTINE TESTING W REFLEX): HIV SCREEN 4TH GENERATION: NONREACTIVE

## 2015-08-06 ENCOUNTER — Encounter: Payer: Self-pay | Admitting: Urology

## 2015-08-06 ENCOUNTER — Ambulatory Visit (INDEPENDENT_AMBULATORY_CARE_PROVIDER_SITE_OTHER): Payer: Medicaid Other | Admitting: Urology

## 2015-08-06 VITALS — BP 120/65 | HR 73 | Ht 70.0 in | Wt 181.0 lb

## 2015-08-06 DIAGNOSIS — Z87438 Personal history of other diseases of male genital organs: Secondary | ICD-10-CM

## 2015-08-06 DIAGNOSIS — R198 Other specified symptoms and signs involving the digestive system and abdomen: Secondary | ICD-10-CM

## 2015-08-06 NOTE — Progress Notes (Signed)
08/06/2015 4:21 PM   Richard Herring [birth year] [id number]  Referring provider: No referring provider defined for this encounter.  Chief Complaint  Patient presents with  . 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 Murlean Iba as the patient is reporting drainage from the perineal site.  Patient states that over the last couple weeks he has noticed a mucous-like drainage he believes is coming from his anus.  He states he feels a tender lump in that area.  He has not had fevers, chills, nausea or vomiting.  He has not had any difficulty with urination. He is having erectile difficulty.  He has not had difficulty with bowel movements.  He denies any penile drainage.  Patient has multiple co morbidities such as history of MI, congestive heart failure and he has been told that there is a spot on his liver.  He also states that he is experiencing extreme reflux causing him nausea and some vomiting.  He stated that he is quite upset as he felt that nobody was helping him with his GI issues.  He is also undergoing dialysis for end-stage renal disease.   PMH: Past Medical History  Diagnosis Date  . Renal disorder   . COPD (chronic obstructive pulmonary disease) ([place])   . A-fib ([place])   . Anxiety   . GERD (gastroesophageal reflux disease)   . Arthritis   . Asthma   . Depression   . Gout   . Heart attack ([place])   . Congestive heart failure ([place])   . Heart disease   . Kidney failure   . Liver disease   . Stomach ulcer     Surgical History: Past Surgical History  Procedure Laterality Date  . Peritoneal wound drainage  2015,2016    gangreen    . Cardiac defibrillator placement  2015  . Dialysis fistula creation  2014    Home Medications:    Medication List       This list is accurate as of: 08/06/15  4:21  PM.  Always use your most recent med list.               ascorbic acid 500 MG tablet  Commonly known as:  VITAMIN C  Take 500 mg by mouth daily.     aspirin EC 81 MG tablet  Take 81 mg by mouth daily. Reported on 08/06/2015     b complex vitamins capsule  Take 1 capsule by mouth daily.     docusate sodium 50 MG capsule  Commonly known as:  COLACE  Take 50 mg by mouth 2 (two) times daily.     folic acid 1 MG tablet  Commonly known as:  FOLVITE  Take 1 mg by mouth daily.     furosemide 40 MG tablet  Commonly known as:  LASIX  Take 80 mg by mouth 2 (two) times daily.     metoprolol 50 MG tablet  Commonly known as:  LOPRESSOR  Take 50 mg by mouth 3 (three) times daily. Reported on 08/06/2015     pramoxine 1 % foam  Commonly known as:  PROCTOFOAM  Place 1 application rectally 3 (three) times daily as needed for itching.     ranitidine 300 MG tablet  Commonly known as:  ZANTAC  Take 300 mg by mouth 2 (two) times daily.     sevelamer carbonate 800 MG tablet  Commonly known as:  RENVELA  Take 800 mg by mouth 3 (three) times daily with meals.     traMADol 50 MG tablet  Commonly known as:  ULTRAM  Take 50 mg by mouth every 8 (eight) hours as needed.        Allergies: No Known Allergies  Family History: Family History  Problem Relation Age of Onset  . Kidney cancer Neg Hx   . Prostate cancer Neg Hx     Social History:  reports that he has been smoking Cigarettes.  He has been smoking about 1.00 pack per day. He does not have any smokeless tobacco history on file. He reports that he does not drink alcohol or use illicit drugs.  ROS: UROLOGY Frequent Urination?: No Hard to postpone urination?: No Burning/pain with urination?: No Get up at night to urinate?: No Leakage of urine?: No Urine stream starts and stops?: No Trouble starting stream?: No Do you have to strain to urinate?: No Blood in urine?: No Urinary tract infection?: No Sexually transmitted  disease?: No Injury to kidneys or bladder?: No Painful intercourse?: No Weak stream?: No Erection problems?: Yes Penile pain?: Yes  Gastrointestinal Nausea?: Yes Vomiting?: Yes Indigestion/heartburn?: Yes Diarrhea?: No Constipation?: No  Constitutional Fever: No Night sweats?: No Weight loss?: No Fatigue?: No  Skin Skin rash/lesions?: Yes Itching?: Yes  Eyes Blurred vision?: No Double vision?: No  Ears/Nose/Throat Sore throat?: No Sinus problems?: Yes  Hematologic/Lymphatic Swollen glands?: No Easy bruising?: Yes  Cardiovascular Leg swelling?: Yes Chest pain?: Yes  Respiratory Cough?: Yes Shortness of breath?: Yes  Endocrine Excessive thirst?: Yes  Musculoskeletal Back pain?: No Joint pain?: Yes  Neurological Headaches?: Yes Dizziness?: Yes  Psychologic Depression?: Yes Anxiety?: Yes  Physical Exam: BP 120/65 mmHg  Pulse 73  Ht '5\' 10"'$  (1.778 m)  Wt 181 lb (82.101 kg)  BMI 25.97 kg/m2  Constitutional: Well nourished. Alert and oriented, No acute distress. HEENT: [place] AT, moist mucus membranes. Trachea midline, no masses. Cardiovascular: No clubbing, cyanosis, or edema. Respiratory: Normal respiratory effort, no increased work of breathing. GI: Abdomen is soft, non tender, non distended, no abdominal masses. Liver and spleen not palpable.  No hernias appreciated.  Stool sample for occult testing is not indicated.   GU: No CVA tenderness.  No bladder fullness or masses.  Patient with uncircumcised phallus. Foreskin easily retracted  Urethral meatus is patent.  No penile discharge. No penile lesions or rashes. Scrotum without lesions, cysts, rashes and/or edema.  Testicles are located scrotally bilaterally. No masses are appreciated in the testicles. Left and right epididymis are normal. No abscesses or drainage seen.   Rectal: Patient with  normal sphincter tone. Anus and perineum without scarring or rashes. No rectal masses are appreciated.  Prostate is approximately 45 grams, no nodules are appreciated. Seminal vesicles are normal.  No abscesses or drainage seen.   Skin: No rashes, bruises or suspicious lesions. Lymph: No cervical or inguinal adenopathy. Neurologic: Grossly intact, no focal deficits, moving all 4 extremities. Psychiatric: Normal mood and affect.  Laboratory Data: Lab Results  Component Value Date   WBC 2.1* 07/02/2015   HGB 10.6* 07/02/2015   HCT 31.2* 07/02/2015   MCV 98.3 07/02/2015   PLT 45* 07/02/2015    Lab Results  Component Value Date   CREATININE 6.82* 07/02/2015    Lab Results  Component Value Date   AST 40  07/02/2015   Lab Results  Component Value Date   ALT 51 07/02/2015     Assessment & Plan:    1. History of Fournier's gangrene:   I have vigorously examined the patient's perineal area including scrotum and rectum. I did not palpate any fluctuant masses or witness any purulent drainage.  There was no erythema or tenderness seen within the perineal area.  I reassured the patient and stated that the mucus may be coming from the rectum or the penis. He is to follow up if he should see an abscess, experience fevers or extreme pain in the perineal area.  2. Anal drainage:   I did not elicit any mucoid drainage during exam.  He is having GI symptoms and has been told there is a spot on his liver. Perhaps, he is experiencing mucus before and after bowel movements.  I will refer him to gastroenterology for further evaluation.  No Follow-up on file.  These notes generated with voice recognition software. I apologize for typographical errors.  Zara Council, [facility] [address] [place], [place] [address] [phone]

## 2015-08-11 ENCOUNTER — Telehealth: Payer: Self-pay | Admitting: Urology

## 2015-08-11 DIAGNOSIS — Z87438 Personal history of other diseases of male genital organs: Secondary | ICD-10-CM | POA: Insufficient documentation

## 2015-08-11 NOTE — Telephone Encounter (Signed)
Would you please send a copy of this office visit to the patient's referring provider, Dr. Murlean Iba?

## 2015-08-13 NOTE — Telephone Encounter (Signed)
Notes faxed.

## 2015-08-15 ENCOUNTER — Other Ambulatory Visit: Payer: Self-pay

## 2015-08-15 ENCOUNTER — Inpatient Hospital Stay: Payer: Medicaid Other

## 2015-08-15 ENCOUNTER — Inpatient Hospital Stay
Admission: EM | Admit: 2015-08-15 | Discharge: 2015-08-16 | DRG: 314 | Disposition: A | Discharge status: Home / Self Care | Payer: Medicaid Other | Attending: Internal Medicine | Admitting: Internal Medicine

## 2015-08-15 DIAGNOSIS — I4891 Unspecified atrial fibrillation: Secondary | ICD-10-CM | POA: Diagnosis present

## 2015-08-15 DIAGNOSIS — N186 End stage renal disease: Secondary | ICD-10-CM | POA: Diagnosis present

## 2015-08-15 DIAGNOSIS — Y832 Surgical operation with anastomosis, bypass or graft as the cause of abnormal reaction of the patient, or of later complication, without mention of misadventure at the time of the procedure: Secondary | ICD-10-CM | POA: Diagnosis present

## 2015-08-15 DIAGNOSIS — Z7982 Long term (current) use of aspirin: Secondary | ICD-10-CM

## 2015-08-15 DIAGNOSIS — T827XXA Infection and inflammatory reaction due to other cardiac and vascular devices, implants and grafts, initial encounter: Secondary | ICD-10-CM | POA: Diagnosis not present

## 2015-08-15 DIAGNOSIS — I132 Hypertensive heart and chronic kidney disease with heart failure and with stage 5 chronic kidney disease, or end stage renal disease: Secondary | ICD-10-CM | POA: Diagnosis present

## 2015-08-15 DIAGNOSIS — M109 Gout, unspecified: Secondary | ICD-10-CM | POA: Diagnosis present

## 2015-08-15 DIAGNOSIS — Z992 Dependence on renal dialysis: Secondary | ICD-10-CM | POA: Diagnosis not present

## 2015-08-15 DIAGNOSIS — K219 Gastro-esophageal reflux disease without esophagitis: Secondary | ICD-10-CM | POA: Diagnosis present

## 2015-08-15 DIAGNOSIS — R1030 Lower abdominal pain, unspecified: Secondary | ICD-10-CM | POA: Diagnosis present

## 2015-08-15 DIAGNOSIS — J984 Other disorders of lung: Secondary | ICD-10-CM | POA: Diagnosis not present

## 2015-08-15 DIAGNOSIS — R197 Diarrhea, unspecified: Secondary | ICD-10-CM

## 2015-08-15 DIAGNOSIS — D696 Thrombocytopenia, unspecified: Secondary | ICD-10-CM | POA: Diagnosis present

## 2015-08-15 DIAGNOSIS — F17210 Nicotine dependence, cigarettes, uncomplicated: Secondary | ICD-10-CM | POA: Diagnosis present

## 2015-08-15 DIAGNOSIS — Z79899 Other long term (current) drug therapy: Secondary | ICD-10-CM | POA: Diagnosis not present

## 2015-08-15 DIAGNOSIS — I509 Heart failure, unspecified: Secondary | ICD-10-CM | POA: Diagnosis present

## 2015-08-15 DIAGNOSIS — R918 Other nonspecific abnormal finding of lung field: Secondary | ICD-10-CM | POA: Diagnosis not present

## 2015-08-15 DIAGNOSIS — R911 Solitary pulmonary nodule: Secondary | ICD-10-CM | POA: Diagnosis present

## 2015-08-15 DIAGNOSIS — N2581 Secondary hyperparathyroidism of renal origin: Secondary | ICD-10-CM | POA: Diagnosis present

## 2015-08-15 DIAGNOSIS — R112 Nausea with vomiting, unspecified: Secondary | ICD-10-CM

## 2015-08-15 DIAGNOSIS — Z95810 Presence of automatic (implantable) cardiac defibrillator: Secondary | ICD-10-CM

## 2015-08-15 DIAGNOSIS — K7460 Unspecified cirrhosis of liver: Secondary | ICD-10-CM | POA: Diagnosis present

## 2015-08-15 DIAGNOSIS — D631 Anemia in chronic kidney disease: Secondary | ICD-10-CM | POA: Diagnosis present

## 2015-08-15 DIAGNOSIS — Z8711 Personal history of peptic ulcer disease: Secondary | ICD-10-CM | POA: Diagnosis not present

## 2015-08-15 DIAGNOSIS — I252 Old myocardial infarction: Secondary | ICD-10-CM

## 2015-08-15 DIAGNOSIS — J449 Chronic obstructive pulmonary disease, unspecified: Secondary | ICD-10-CM | POA: Diagnosis present

## 2015-08-15 DIAGNOSIS — J432 Centrilobular emphysema: Secondary | ICD-10-CM | POA: Diagnosis not present

## 2015-08-15 LAB — URINALYSIS COMPLETE WITH MICROSCOPIC (ARMC ONLY)
BILIRUBIN URINE: NEGATIVE
Bacteria, UA: NONE SEEN
GLUCOSE, UA: NEGATIVE mg/dL
KETONES UR: NEGATIVE mg/dL
Leukocytes, UA: NEGATIVE
Nitrite: NEGATIVE
Protein, ur: NEGATIVE mg/dL
RBC / HPF: NONE SEEN RBC/hpf (ref 0–5)
SPECIFIC GRAVITY, URINE: 1.003 — AB (ref 1.005–1.030)
Squamous Epithelial / LPF: NONE SEEN
pH: 9 — ABNORMAL HIGH (ref 5.0–8.0)

## 2015-08-15 LAB — COMPREHENSIVE METABOLIC PANEL
ALK PHOS: 72 U/L (ref 38–126)
ALT: 91 U/L — ABNORMAL HIGH (ref 17–63)
ANION GAP: 10 (ref 5–15)
AST: 75 U/L — ABNORMAL HIGH (ref 15–41)
Albumin: 3.2 g/dL — ABNORMAL LOW (ref 3.5–5.0)
BILIRUBIN TOTAL: 0.4 mg/dL (ref 0.3–1.2)
BUN: 27 mg/dL — ABNORMAL HIGH (ref 6–20)
CALCIUM: 8 mg/dL — AB (ref 8.9–10.3)
CO2: 28 mmol/L (ref 22–32)
Chloride: 99 mmol/L — ABNORMAL LOW (ref 101–111)
Creatinine, Ser: 4.35 mg/dL — ABNORMAL HIGH (ref 0.61–1.24)
GFR calc non Af Amer: 14 mL/min — ABNORMAL LOW (ref >60)
GFR, EST AFRICAN AMERICAN: 16 mL/min — AB (ref >60)
Glucose, Bld: 83 mg/dL (ref 65–99)
POTASSIUM: 3 mmol/L — AB (ref 3.5–5.1)
SODIUM: 137 mmol/L (ref 135–145)
TOTAL PROTEIN: 6.5 g/dL (ref 6.5–8.1)

## 2015-08-15 LAB — CBC WITH DIFFERENTIAL/PLATELET
Basophils Absolute: 0 10*3/uL (ref 0–0.1)
Basophils Relative: 1 %
EOS ABS: 0.1 10*3/uL (ref 0–0.7)
Eosinophils Relative: 3 %
HCT: 34.1 % — ABNORMAL LOW (ref 40.0–52.0)
HEMOGLOBIN: 11.7 g/dL — AB (ref 13.0–18.0)
LYMPHS ABS: 0.6 10*3/uL — AB (ref 1.0–3.6)
Lymphocytes Relative: 26 %
MCH: 34 pg (ref 26.0–34.0)
MCHC: 34.3 g/dL (ref 32.0–36.0)
MCV: 99.1 fL (ref 80.0–100.0)
MONO ABS: 0.2 10*3/uL (ref 0.2–1.0)
Neutro Abs: 1.3 10*3/uL — ABNORMAL LOW (ref 1.4–6.5)
Platelets: 53 10*3/uL — ABNORMAL LOW (ref 150–440)
RBC: 3.44 MIL/uL — ABNORMAL LOW (ref 4.40–5.90)
RDW: 15 % — AB (ref 11.5–14.5)
WBC: 2.3 10*3/uL — ABNORMAL LOW (ref 3.8–10.6)

## 2015-08-15 LAB — LIPASE, BLOOD: Lipase: 37 U/L (ref 11–51)

## 2015-08-15 LAB — TROPONIN I

## 2015-08-15 LAB — MRSA PCR SCREENING: MRSA by PCR: POSITIVE — AB

## 2015-08-15 MED ORDER — ACETAMINOPHEN 650 MG RE SUPP: 650.0000 mg | Freq: Four times a day (QID) | RECTAL | Status: DC | PRN

## 2015-08-15 MED ORDER — MORPHINE SULFATE (PF) 2 MG/ML IV SOLN
1.0000 mg | INTRAVENOUS | Status: DC | PRN
  Administered 2015-08-15 – 2015-08-16 (×3): 1 mg via INTRAVENOUS
  Filled 2015-08-15 (×3): qty 1

## 2015-08-15 MED ORDER — VANCOMYCIN HCL IN DEXTROSE 750-5 MG/150ML-% IV SOLN
750.0000 mg | Freq: Once | INTRAVENOUS | Status: AC
  Administered 2015-08-15: 750 mg via INTRAVENOUS
  Filled 2015-08-15: qty 150

## 2015-08-15 MED ORDER — FOLIC ACID 1 MG PO TABS
1.0000 mg | ORAL_TABLET | Freq: Every day | ORAL | Status: DC
Start: 2015-08-16 — End: 2015-08-16
  Administered 2015-08-16: 1 mg via ORAL
  Filled 2015-08-15: qty 1

## 2015-08-15 MED ORDER — ENOXAPARIN SODIUM 40 MG/0.4ML ~~LOC~~ SOLN: 40.0000 mg | SUBCUTANEOUS | Status: DC

## 2015-08-15 MED ORDER — DOCUSATE SODIUM 50 MG PO CAPS
50.0000 mg | ORAL_CAPSULE | Freq: Two times a day (BID) | ORAL | Status: DC
  Filled 2015-08-15 (×4): qty 1

## 2015-08-15 MED ORDER — ONDANSETRON HCL 4 MG/2ML IJ SOLN: 4.0000 mg | Freq: Four times a day (QID) | INTRAMUSCULAR | Status: DC | PRN

## 2015-08-15 MED ORDER — ONDANSETRON HCL 4 MG/2ML IJ SOLN
4.0000 mg | Freq: Once | INTRAMUSCULAR | Status: AC
  Administered 2015-08-15: 4 mg via INTRAVENOUS
  Filled 2015-08-15: qty 2

## 2015-08-15 MED ORDER — ACETAMINOPHEN 325 MG PO TABS
650.0000 mg | ORAL_TABLET | Freq: Four times a day (QID) | ORAL | Status: DC | PRN
Start: 2015-08-15 — End: 2015-08-16

## 2015-08-15 MED ORDER — ASPIRIN EC 81 MG PO TBEC
81.0000 mg | DELAYED_RELEASE_TABLET | Freq: Every day | ORAL | Status: DC
  Administered 2015-08-16: 81 mg via ORAL
  Filled 2015-08-15: qty 1

## 2015-08-15 MED ORDER — VANCOMYCIN HCL IN DEXTROSE 1-5 GM/200ML-% IV SOLN: 1000.0000 mg | INTRAVENOUS | Status: DC

## 2015-08-15 MED ORDER — RENA-VITE PO TABS
1.0000 | ORAL_TABLET | Freq: Every day | ORAL | Status: DC
  Administered 2015-08-16: 1 via ORAL
  Filled 2015-08-15: qty 1

## 2015-08-15 MED ORDER — HEPARIN SODIUM (PORCINE) 5000 UNIT/ML IJ SOLN
5000.0000 [IU] | Freq: Three times a day (TID) | INTRAMUSCULAR | Status: DC
  Filled 2015-08-15: qty 1

## 2015-08-15 MED ORDER — FUROSEMIDE 40 MG PO TABS
80.0000 mg | ORAL_TABLET | Freq: Two times a day (BID) | ORAL | Status: DC
  Administered 2015-08-15 – 2015-08-16 (×2): 80 mg via ORAL
  Filled 2015-08-15 (×2): qty 2

## 2015-08-15 MED ORDER — DIATRIZOATE MEGLUMINE & SODIUM 66-10 % PO SOLN
15.0000 mL | Freq: Once | ORAL | Status: AC
  Administered 2015-08-15: 15 mL via ORAL

## 2015-08-15 MED ORDER — ONDANSETRON HCL 4 MG PO TABS: 4.0000 mg | ORAL_TABLET | Freq: Four times a day (QID) | ORAL | Status: DC | PRN

## 2015-08-15 MED ORDER — MORPHINE SULFATE (PF) 4 MG/ML IV SOLN
4.0000 mg | Freq: Once | INTRAVENOUS | Status: AC
  Administered 2015-08-15: 4 mg via INTRAVENOUS
  Filled 2015-08-15: qty 1

## 2015-08-15 MED ORDER — TRAMADOL HCL 50 MG PO TABS: 50.0000 mg | ORAL_TABLET | Freq: Four times a day (QID) | ORAL | Status: DC | PRN

## 2015-08-15 MED ORDER — NICOTINE 21 MG/24HR TD PT24
21.0000 mg | MEDICATED_PATCH | Freq: Every day | TRANSDERMAL | Status: DC
  Administered 2015-08-15 – 2015-08-16 (×2): 21 mg via TRANSDERMAL
  Filled 2015-08-15 (×2): qty 1

## 2015-08-15 MED ORDER — VITAMIN C 500 MG PO TABS
500.0000 mg | ORAL_TABLET | Freq: Every day | ORAL | Status: DC
  Administered 2015-08-15 – 2015-08-16 (×2): 500 mg via ORAL
  Filled 2015-08-15 (×2): qty 1

## 2015-08-15 MED ORDER — HYDROCODONE-ACETAMINOPHEN 5-325 MG PO TABS
1.0000 | ORAL_TABLET | ORAL | Status: DC | PRN
  Administered 2015-08-16 (×3): 2 via ORAL
  Filled 2015-08-15 (×3): qty 2

## 2015-08-15 MED ORDER — SEVELAMER CARBONATE 800 MG PO TABS
800.0000 mg | ORAL_TABLET | Freq: Three times a day (TID) | ORAL | Status: DC
  Administered 2015-08-15 – 2015-08-16 (×3): 800 mg via ORAL
  Filled 2015-08-15 (×3): qty 1

## 2015-08-15 MED ORDER — SENNOSIDES-DOCUSATE SODIUM 8.6-50 MG PO TABS
1.0000 | ORAL_TABLET | Freq: Every evening | ORAL | Status: DC | PRN
  Administered 2015-08-16: 1 via ORAL
  Filled 2015-08-15: qty 1

## 2015-08-15 MED ORDER — METOPROLOL TARTRATE 50 MG PO TABS
50.0000 mg | ORAL_TABLET | Freq: Three times a day (TID) | ORAL | Status: DC
  Administered 2015-08-15: 50 mg via ORAL
  Filled 2015-08-15 (×2): qty 1

## 2015-08-15 NOTE — Progress Notes (Signed)
Pharmacy Antibiotic Note  Richard Herring is a 60 y.o. male admitted on 08/15/2015 with infected AV graft.  Pharmacy has been consulted for Vancomycin dosing. Patient is ESRD and was at dialysis today when purulent drainage was coming from AV graft site. Patient received Vancomycin 1g IV at the dialysis center according to notes.  Plan: Will order Vancomycin '750mg'$  IV once to complete a loading dose of '1750mg'$ .  Will order Vancomycin 1g IV with each dialysis session. Will check a trough level prior to 3rd dialysis session.   Temp (24hrs), Avg:98.3 F (36.8 C), Min:98.2 F (36.8 C), Max:98.4 F (36.9 C)   Recent Labs Lab 08/15/15 1546  WBC 2.3*  CREATININE 4.35*    Estimated Creatinine Clearance: 19.1 mL/min (by C-G formula based on Cr of 4.35).    No Known Allergies  Antimicrobials this admission: Vancomycin 6/24 >>   Dose adjustments this admission:  Microbiology results:  Thank you for allowing pharmacy to be a part of this patient's care.  Paulina Fusi, PharmD, BCPS 08/15/2015 7:16 PM

## 2015-08-15 NOTE — H&P (Signed)
[facility] NAME: Richard Herring    MR#:  [id number]  DATE OF BIRTH:  [birth year]  DATE OF ADMISSION:  08/15/2015  PRIMARY CARE PHYSICIAN: Murlean Iba, MD   REQUESTING/REFERRING PHYSICIAN: Dr Dineen Kid  CHIEF COMPLAINT:   Upper extremity pain HISTORY OF PRESENT ILLNESS:  Richard Herring  is a 60 y.o. male with a known history of ESRD on hemodialysis who presented to the emergency room due to purulent discharge from a fistula and pain. Patient reports is been going on for several weeks. Today he noted green/yellow discharge. He denies fevers or chills. He does state he has generalized weakness. He also reports several episodes of nausea and vomiting. He had diarrhea a few days ago which is now resolved. He has abdominal pain from vomiting. His last bowel movement was yesterday.  PAST MEDICAL HISTORY:   Past Medical History  Diagnosis Date  . Renal disorder   . COPD (chronic obstructive pulmonary disease) ([place])   . A-fib ([place])   . Anxiety   . GERD (gastroesophageal reflux disease)   . Arthritis   . Asthma   . Depression   . Gout   . Heart attack ([place])   . Congestive heart failure ([place])   . Heart disease   . Kidney failure   . Liver disease   . Stomach ulcer     PAST SURGICAL HISTORY:   Past Surgical History  Procedure Laterality Date  . Peritoneal wound drainage  2015,2016    gangreen    . Cardiac defibrillator placement  2015  . Dialysis fistula creation  2014    SOCIAL HISTORY:   Social History  Substance Use Topics  . Smoking status: Current Every Day Smoker -- 1.00 packs/day    Types: Cigarettes  . Smokeless tobacco: No  . Alcohol Use: No    FAMILY HISTORY:   Family History  Problem Relation Age of Onset  . Kidney cancer Neg Hx   . Prostate cancer Neg Hx     DRUG ALLERGIES:  No Known Allergies  REVIEW OF SYSTEMS:   Review of Systems  Constitutional: Negative for fever, chills and malaise/fatigue.   HENT: Negative for ear discharge, ear pain, hearing loss, nosebleeds and sore throat.   Eyes: Negative for blurred vision and pain.  Respiratory: Negative for cough, hemoptysis, shortness of breath and wheezing.   Cardiovascular: Negative for chest pain, palpitations and leg swelling.  Gastrointestinal: Negative for nausea, vomiting, abdominal pain, diarrhea and blood in stool.  Genitourinary: Negative for dysuria.  Musculoskeletal: Negative for back pain.  Skin:       Left arm AV fistula with scabbing and drainage  Neurological: Positive for weakness. Negative for dizziness, tremors, speech change, focal weakness, seizures and headaches.  Endo/Heme/Allergies: Does not bruise/bleed easily.  Psychiatric/Behavioral: Negative for depression, suicidal ideas and hallucinations.    MEDICATIONS AT HOME:   Prior to Admission medications   Medication Sig Start Date End Date Taking? Authorizing Provider  ascorbic acid (VITAMIN C) 500 MG tablet Take 500 mg by mouth daily.   Yes Historical Provider, MD  aspirin EC 81 MG tablet Take 81 mg by mouth daily. Reported on 08/06/2015   Yes Historical Provider, MD  b complex vitamins capsule Take 1 capsule by mouth daily.   Yes Historical Provider, MD  docusate sodium (COLACE) 50 MG capsule Take 50 mg by mouth 2 (two) times daily.   Yes Historical Provider, MD  folic acid (FOLVITE) 1 MG tablet  Take 1 mg by mouth daily.   Yes Historical Provider, MD  furosemide (LASIX) 40 MG tablet Take 80 mg by mouth 2 (two) times daily.   Yes Historical Provider, MD  metoprolol (LOPRESSOR) 50 MG tablet Take 50 mg by mouth 3 (three) times daily. Reported on 08/06/2015   Yes Historical Provider, MD  pramoxine (PROCTOFOAM) 1 % foam Place 1 application rectally 3 (three) times daily as needed for itching.   Yes Historical Provider, MD  ranitidine (ZANTAC) 300 MG tablet Take 300 mg by mouth 2 (two) times daily.   Yes Historical Provider, MD  sevelamer carbonate (RENVELA) 800 MG  tablet Take 800 mg by mouth 3 (three) times daily with meals.   Yes Historical Provider, MD  traMADol (ULTRAM) 50 MG tablet Take 50 mg by mouth every 8 (eight) hours as needed.   Yes Historical Provider, MD      VITAL SIGNS:  Blood pressure 112/57, pulse 83, temperature 98.4 F (36.9 C), temperature source Oral, resp. rate 16, SpO2 99 %.  PHYSICAL EXAMINATION:   Physical Exam  Constitutional: He is oriented to person, place, and time and well-developed, well-nourished, and in no distress. No distress.  HENT:  Head: Normocephalic.  Eyes: No scleral icterus.  Neck: Normal range of motion. Neck supple. No JVD present. No tracheal deviation present.  Cardiovascular: Normal rate, regular rhythm and normal heart sounds.  Exam reveals no gallop and no friction rub.   No murmur heard. Pulmonary/Chest: Effort normal and breath sounds normal. No respiratory distress. He has no wheezes. He has no rales. He exhibits no tenderness.  Abdominal: Soft. Bowel sounds are normal. He exhibits no distension and no mass. There is no tenderness. There is no rebound and no guarding.  Musculoskeletal: Normal range of motion. He exhibits no edema.  Neurological: He is alert and oriented to person, place, and time.  Skin: Skin is warm. No rash noted. No erythema.  Left arm AV fistula with drainage and a scab  Psychiatric: Affect and judgment normal.      LABORATORY PANEL:   CBC  Recent Labs Lab 08/15/15 1546  WBC 2.3*  HGB 11.7*  HCT 34.1*  PLT 53*   ------------------------------------------------------------------------------------------------------------------  Chemistries   Recent Labs Lab 08/15/15 1546  NA 137  K 3.0*  CL 99*  CO2 28  GLUCOSE 83  BUN 27*  CREATININE 4.35*  CALCIUM 8.0*  AST 75*  ALT 91*  ALKPHOS 72  BILITOT 0.4   ------------------------------------------------------------------------------------------------------------------  Cardiac Enzymes  Recent  Labs Lab 08/15/15 1546  TROPONINI <0.03   ------------------------------------------------------------------------------------------------------------------  RADIOLOGY:  No results found.  EKG:   Atrial fibrillation heart rate 76 no ST elevation or depression.  IMPRESSION AND PLAN:   60 year old male with a history of ESRD on hemodialysis who presents with infected AV fistula.  1. Infected dialysis fistula: Continue IV vancomycin. I have discussed case with vascular surgery. Order blood cultures.  2. Nausea and vomiting: CT scan ordered by ER physician which is pending.  3. ESRD on hemodialysis: Consult nephrology for further recommendations and management.  4. Essential hypertension: Continue metoprolol  5. Atrial fibrillation: Continue metoprolol for heart rate control and aspirin 6. Tobacco dependence: Patient is highly encouraged to stop smoking. He reports that he does want to try to quit smoking. He does not 1 nicotine patch at this time. Counseled for 3 minutes.   7. Thrombocytopenia, chronic: Platelet count is near baseline. All the records are reviewed and case discussed with ED provider.  Management plans discussed with the patient and he is in agreement  CODE STATUS: FULL  TOTAL TIME TAKING CARE OF THIS PATIENT: 45 minutes.    Elfreida Heggs M.D on 08/15/2015 at 4:28 PM  Between 7am to 6pm - Pager - [phone]  After 6pm go to [email or web address] - password [email or web address] [facility]  Office  [phone]  CC: Primary care physician; Murlean Iba, MD

## 2015-08-15 NOTE — ED Notes (Signed)
Pt came to ED via EMS from dialysis. Pt reports he has been feeling weak for about a week. Denies fevers. Received whole dialysis treatment today. Dialysis wanted pt sent over here for infection of dialysis site. Pt received 1g vancomycin.

## 2015-08-15 NOTE — ED Provider Notes (Signed)
[facility] Emergency Department Provider Note   ____________________________________________  Time seen: Seen upon arrival to the emergency department  I have reviewed the triage vital signs and the nursing notes.   HISTORY  Chief Complaint Weakness   HPI Richard Herring is a 60 y.o. male with a history of end-stage renal disease on dialysis was presented to the emergency department today for evaluation of his left upper extremity fistula. He said that he had some drainage from the fistula earlier this morning and it has been very painful. He says that the pain is especially bad when he is taking his treatment. Despite this, he did have a full treatment prior to arrival today. He also states that he has been feeling weak over the past week. He says that he also has lower abdominal pain which is cramping and sharp and associated with multiple episodes of vomiting per day. He says he may vomit 3 episodes per day and also have diarrhea. He denies any blood in his vomit or diarrhea. He is requesting pain medications for the pain is expressing his fistula.   Past Medical History  Diagnosis Date  . Renal disorder   . COPD (chronic obstructive pulmonary disease) ([place])   . A-fib ([place])   . Anxiety   . GERD (gastroesophageal reflux disease)   . Arthritis   . Asthma   . Depression   . Gout   . Heart attack ([place])   . Congestive heart failure ([place])   . Heart disease   . Kidney failure   . Liver disease   . Stomach ulcer     Patient Active Problem List   Diagnosis Date Noted  . History of Fournier's gangrene 08/11/2015  . Acute gastroenteritis 07/01/2015    Past Surgical History  Procedure Laterality Date  . Peritoneal wound drainage  2015,2016    gangreen    . Cardiac defibrillator placement  2015  . Dialysis fistula creation  2014    Current Outpatient Rx  Name  Route  Sig  Dispense  Refill  . ascorbic acid (VITAMIN C) 500 MG tablet   Oral   Take  500 mg by mouth daily.         Marland Kitchen aspirin EC 81 MG tablet   Oral   Take 81 mg by mouth daily. Reported on 08/06/2015         . b complex vitamins capsule   Oral   Take 1 capsule by mouth daily.         Marland Kitchen docusate sodium (COLACE) 50 MG capsule   Oral   Take 50 mg by mouth 2 (two) times daily.         . folic acid (FOLVITE) 1 MG tablet   Oral   Take 1 mg by mouth daily.         . furosemide (LASIX) 40 MG tablet   Oral   Take 80 mg by mouth 2 (two) times daily.         . metoprolol (LOPRESSOR) 50 MG tablet   Oral   Take 50 mg by mouth 3 (three) times daily. Reported on 08/06/2015         . pramoxine (PROCTOFOAM) 1 % foam   Rectal   Place 1 application rectally 3 (three) times daily as needed for itching.         . ranitidine (ZANTAC) 300 MG tablet   Oral   Take 300 mg by mouth 2 (two) times daily.         Marland Kitchen  sevelamer carbonate (RENVELA) 800 MG tablet   Oral   Take 800 mg by mouth 3 (three) times daily with meals.         . traMADol (ULTRAM) 50 MG tablet   Oral   Take 50 mg by mouth every 8 (eight) hours as needed.           Allergies Review of patient's allergies indicates no known allergies.  Family History  Problem Relation Age of Onset  . Kidney cancer Neg Hx   . Prostate cancer Neg Hx     Social History Social History  Substance Use Topics  . Smoking status: Current Every Day Smoker -- 1.00 packs/day    Types: Cigarettes  . Smokeless tobacco: None  . Alcohol Use: No    Review of Systems onstitutional: No fever/chills Eyes: No visual changes. ENT: No sore throat. Cardiovascular: Denies chest pain. Respiratory: Denies shortness of breath. Gastrointestinal:  No constipation. Genitourinary: Negative for dysuria. Musculoskeletal: Negative for back pain. Skin: Negative for rash. Neurological: Negative for headaches, focal weakness or numbness.  10-point ROS otherwise  negative.  ____________________________________________   PHYSICAL EXAM:  VITAL SIGNS: ED Triage Vitals  Enc Vitals Group     BP 08/15/15 1513 112/57 mmHg     Pulse Rate 08/15/15 1513 83     Resp 08/15/15 1513 16     Temp 08/15/15 1513 98.4 F (36.9 C)     Temp Source 08/15/15 1513 Oral     SpO2 08/15/15 1513 99 %     Weight --      Height --      Head Cir --      Peak Flow --      Pain Score --      Pain Loc --      Pain Edu? --      Excl. in [place]? --     Constitutional: Alert and oriented. Well appearing and in no acute distress. Eyes: Conjunctivae are normal. PERRL. EOMI. Head: Atraumatic. Nose: No congestion/rhinnorhea. Mouth/Throat: Mucous membranes are moist.   Neck: No stridor.   Cardiovascular: Normal rate, regular rhythm. Grossly normal heart sounds.  Left upper 70 fistula with palpable thrill. Respiratory: Normal respiratory effort.  No retractions. Lungs CTAB. Gastrointestinal: Mild tenderness to palpation across the lower abdomen. No rebound or guarding. No tenderness to the upper abdomen. No CVA tenderness. Musculoskeletal: No lower extremity tenderness nor edema.  No joint effusions. Neurologic:  Normal speech and language. No gross focal neurologic deficits are appreciated. No gait instability. Skin:  Several scabbed areas over the left upper extremity fistula without any expressible pus. However, there is an area over the middle of the fistula which appears to have drained. It is slightly depressed with an overlying scabs tissue.  Mild tenderness diffusely to the fistula. Mild erythema over the central area where the scab is located. Psychiatric: Mood and affect are normal. Speech and behavior are normal.  ____________________________________________   LABS (all labs ordered are listed, but only abnormal results are displayed)  Labs Reviewed  CBC WITH DIFFERENTIAL/PLATELET - Abnormal; Notable for the following:    WBC 2.3 (*)    RBC 3.44 (*)     Hemoglobin 11.7 (*)    HCT 34.1 (*)    RDW 15.0 (*)    Platelets 53 (*)    Neutro Abs 1.3 (*)    Lymphs Abs 0.6 (*)    All other components within normal limits  COMPREHENSIVE METABOLIC PANEL - Abnormal; Notable for  the following:    Potassium 3.0 (*)    Chloride 99 (*)    BUN 27 (*)    Creatinine, Ser 4.35 (*)    Calcium 8.0 (*)    Albumin 3.2 (*)    AST 75 (*)    ALT 91 (*)    GFR calc non Af Amer 14 (*)    GFR calc Af Amer 16 (*)    All other components within normal limits  LIPASE, BLOOD  TROPONIN I  URINALYSIS COMPLETEWITH MICROSCOPIC ([facility] ONLY)   ____________________________________________  EKG  ED ECG REPORT I, Doran Stabler, the attending physician, personally viewed and interpreted this ECG.   Date: 08/15/2015  EKG Time: 1527  Rate: 76  Rhythm: Sinus rhythm versus atrial fibrillation versus flutter. Appears to be P waves in 2, 3 and aVF.  Axis: Normal axis  Intervals:none  ST&T Change: No ST segment elevation or depression. No abnormal T-wave inversion.  ____________________________________________  RADIOLOGY  Pending CAT scan of the abdomen and pelvis. ____________________________________________   PROCEDURES   ____________________________________________   INITIAL IMPRESSION / ASSESSMENT AND PLAN / ED COURSE  Pertinent labs & imaging results that were available during my care of the patient were reviewed by me and considered in my medical decision making (see chart for details).  ----------------------------------------- 4:22 PM on 08/15/2015 -----------------------------------------  We'll admit the patient for IV antibiotics. Discussed case with Dr. Juleen China who was called from the dialysis center about the patient's issue today. He recommends admission as well as a vascular surgery consult. I also discussed with him imaging of the patient's abdomen and he says to scan without IV contrast because of the patient's renal failure. Signed  out to Dr. Genia Harold. Patient aware of need for admission and wanted to comply. Dr. Genia Harold to follow up with CAT scan. ____________________________________________   FINAL CLINICAL IMPRESSION(S) / ED DIAGNOSES  Lower abdominal pain with nausea vomiting and diarrhea. Infected dialysis fistula.    NEW MEDICATIONS STARTED DURING THIS VISIT:  New Prescriptions   No medications on file     Note:  This document was prepared using Dragon voice recognition software and may include unintentional dictation errors.    Orbie Pyo, MD 08/15/15 [phone]

## 2015-08-15 NOTE — ED Notes (Signed)
Pt transported to CT ?

## 2015-08-16 DIAGNOSIS — Z720 Tobacco use: Secondary | ICD-10-CM

## 2015-08-16 DIAGNOSIS — T827XXA Infection and inflammatory reaction due to other cardiac and vascular devices, implants and grafts, initial encounter: Principal | ICD-10-CM

## 2015-08-16 DIAGNOSIS — R918 Other nonspecific abnormal finding of lung field: Secondary | ICD-10-CM

## 2015-08-16 DIAGNOSIS — J432 Centrilobular emphysema: Secondary | ICD-10-CM

## 2015-08-16 DIAGNOSIS — J984 Other disorders of lung: Secondary | ICD-10-CM

## 2015-08-16 DIAGNOSIS — Z716 Tobacco abuse counseling: Secondary | ICD-10-CM

## 2015-08-16 LAB — BASIC METABOLIC PANEL
ANION GAP: 11 (ref 5–15)
BUN: 36 mg/dL — AB (ref 6–20)
CALCIUM: 8.1 mg/dL — AB (ref 8.9–10.3)
CO2: 26 mmol/L (ref 22–32)
CREATININE: 5.67 mg/dL — AB (ref 0.61–1.24)
Chloride: 100 mmol/L — ABNORMAL LOW (ref 101–111)
GFR calc Af Amer: 12 mL/min — ABNORMAL LOW (ref >60)
GFR, EST NON AFRICAN AMERICAN: 10 mL/min — AB (ref >60)
GLUCOSE: 88 mg/dL (ref 65–99)
Potassium: 3.4 mmol/L — ABNORMAL LOW (ref 3.5–5.1)
SODIUM: 137 mmol/L (ref 135–145)

## 2015-08-16 LAB — CBC
HCT: 34.9 % — ABNORMAL LOW (ref 40.0–52.0)
HEMOGLOBIN: 12.1 g/dL — AB (ref 13.0–18.0)
MCH: 34.6 pg — ABNORMAL HIGH (ref 26.0–34.0)
MCHC: 34.6 g/dL (ref 32.0–36.0)
MCV: 100.1 fL — ABNORMAL HIGH (ref 80.0–100.0)
Platelets: 55 10*3/uL — ABNORMAL LOW (ref 150–440)
RBC: 3.48 MIL/uL — AB (ref 4.40–5.90)
RDW: 14.8 % — ABNORMAL HIGH (ref 11.5–14.5)
WBC: 2.7 10*3/uL — ABNORMAL LOW (ref 3.8–10.6)

## 2015-08-16 MED ORDER — DOCUSATE SODIUM 50 MG/5ML PO LIQD
50.0000 mg | Freq: Two times a day (BID) | ORAL | Status: DC
  Administered 2015-08-16: 50 mg via ORAL
  Filled 2015-08-16 (×2): qty 10

## 2015-08-16 MED ORDER — DIPHENHYDRAMINE HCL 25 MG PO CAPS
25.0000 mg | ORAL_CAPSULE | Freq: Three times a day (TID) | ORAL | Status: DC | PRN
  Administered 2015-08-16: 25 mg via ORAL
  Filled 2015-08-16: qty 1

## 2015-08-16 MED ORDER — MUPIROCIN CALCIUM 2 % EX CREA
TOPICAL_CREAM | Freq: Two times a day (BID) | CUTANEOUS | Status: DC
  Administered 2015-08-16: 10:00:00 via TOPICAL
  Filled 2015-08-16 (×2): qty 15

## 2015-08-16 MED ORDER — VANCOMYCIN HCL IN DEXTROSE 1-5 GM/200ML-% IV SOLN: 1000.0000 mg | INTRAVENOUS | Status: DC

## 2015-08-16 MED ORDER — CHLORHEXIDINE GLUCONATE CLOTH 2 % EX PADS: 6.0000 | MEDICATED_PAD | Freq: Every day | CUTANEOUS | Status: DC

## 2015-08-16 NOTE — Progress Notes (Signed)
08/16/2015 13:04  Drue Flirt to be D/C'd assisted living per MD order.  Discussed prescriptions and follow up appointments with the patient. Prescriptions given to patient, medication list explained in detail. Pt verbalized understanding.    Medication List    TAKE these medications        ascorbic acid 500 MG tablet  Commonly known as:  VITAMIN C  Take 500 mg by mouth daily.     aspirin EC 81 MG tablet  Take 81 mg by mouth daily. Reported on 08/06/2015     b complex vitamins capsule  Take 1 capsule by mouth daily.     docusate sodium 50 MG capsule  Commonly known as:  COLACE  Take 50 mg by mouth 2 (two) times daily.     folic acid 1 MG tablet  Commonly known as:  FOLVITE  Take 1 mg by mouth daily.     furosemide 40 MG tablet  Commonly known as:  LASIX  Take 80 mg by mouth 2 (two) times daily.     metoprolol 50 MG tablet  Commonly known as:  LOPRESSOR  Take 50 mg by mouth 3 (three) times daily. Reported on 08/06/2015     pramoxine 1 % foam  Commonly known as:  PROCTOFOAM  Place 1 application rectally 3 (three) times daily as needed for itching.     ranitidine 300 MG tablet  Commonly known as:  ZANTAC  Take 300 mg by mouth 2 (two) times daily.     sevelamer carbonate 800 MG tablet  Commonly known as:  RENVELA  Take 800 mg by mouth 3 (three) times daily with meals.     traMADol 50 MG tablet  Commonly known as:  ULTRAM  Take 50 mg by mouth every 8 (eight) hours as needed.     vancomycin 1-5 GM/200ML-% Soln  Commonly known as:  VANCOCIN  Inject 200 mLs (1,000 mg total) into the vein Every Tuesday,Thursday,and Saturday with dialysis.  Start taking on:  08/18/2015        Filed Vitals:   08/16/15 0802 08/16/15 0915  BP: 94/56 105/68  Pulse: 73   Temp: 98.1 F (36.7 C)   Resp: 16     Skin clean, dry and intact without evidence of skin break down, no evidence of skin tears noted. IV catheter discontinued intact. Site without signs and symptoms of  complications. Dressing and pressure applied. Pt denies pain at this time. No complaints noted.  An After Visit Summary was printed and given to the patient. Patient escorted via [place], and D/C home via taxi.  Richard Herring

## 2015-08-16 NOTE — Progress Notes (Signed)
[facility]  ROUNDING NOTE   Subjective:   Sent from [facility] after dialysis treatment due to pain, erythema and purulent drainage from left arm AVF. Patient states that he was treated around edema and aneurysm with no difficulties. Blood cultures   Objective:  Vital signs in last 24 hours:  Temp:  [97.8 F (36.6 C)-98.4 F (36.9 C)] 98.1 F (36.7 C) (06/25 0802) Pulse Rate:  [49-90] 73 (06/25 0802) Resp:  [16-20] 16 (06/25 0802) BP: (94-122)/(56-75) 105/68 mmHg (06/25 0915) SpO2:  [98 %-100 %] 100 % (06/25 0802) Weight:  [82.101 kg (181 lb)] 82.101 kg (181 lb) (06/24 2017)  Weight change:  Filed Weights   08/15/15 2017  Weight: 82.101 kg (181 lb)    Intake/Output:     Intake/Output this shift:     Physical Exam: General: NAD,   Head: Normocephalic, atraumatic. Moist oral mucosal membranes  Eyes: Anicteric, PERRL  Neck: Supple, trachea midline  Lungs:  Wheezing bilaterally  Heart: Regular rate and rhythm  Abdomen:  Soft, nontender,   Extremities: no peripheral edema.  Neurologic: Nonfocal, moving all four extremities  Skin: No lesions  Access: Left arm AVF with aneurysms and scabbing    Basic Metabolic Panel:  Recent Labs Lab 08/15/15 1546 08/16/15 0443  NA 137 137  K 3.0* 3.4*  CL 99* 100*  CO2 28 26  GLUCOSE 83 88  BUN 27* 36*  CREATININE 4.35* 5.67*  CALCIUM 8.0* 8.1*    Liver Function Tests:  Recent Labs Lab 08/15/15 1546  AST 75*  ALT 91*  ALKPHOS 72  BILITOT 0.4  PROT 6.5  ALBUMIN 3.2*    Recent Labs Lab 08/15/15 1546  LIPASE 37   No results for input(s): AMMONIA in the last 168 hours.  CBC:  Recent Labs Lab 08/15/15 1546 08/16/15 0443  WBC 2.3* 2.7*  NEUTROABS 1.3*  --   HGB 11.7* 12.1*  HCT 34.1* 34.9*  MCV 99.1 100.1*  PLT 53* 55*    Cardiac Enzymes:  Recent Labs Lab 08/15/15 1546  TROPONINI <0.03    BNP: Invalid input(s): POCBNP  CBG: No results for input(s): GLUCAP in the  last 168 hours.  Microbiology: Results for orders placed or performed during the hospital encounter of 08/15/15  Culture, blood (Routine X 2) w Reflex to ID Panel     Status: None (Preliminary result)   Collection Time: 08/15/15  4:41 PM  Result Value Ref Range Status   Specimen Description BLOOD RIGHT HAND  Final   Special Requests   Final    BOTTLES DRAWN AEROBIC AND ANAEROBIC  AEROBIC 8CC, ANAEROBIC 4CC   Culture NO GROWTH < 24 HOURS  Final   Report Status PENDING  Incomplete  Culture, blood (Routine X 2) w Reflex to ID Panel     Status: None (Preliminary result)   Collection Time: 08/15/15  4:42 PM  Result Value Ref Range Status   Specimen Description BLOOD RIGHT ASSIST CONTROL  Final   Special Requests BOTTLES DRAWN AEROBIC AND ANAEROBIC  4CC  Final   Culture NO GROWTH < 24 HOURS  Final   Report Status PENDING  Incomplete  MRSA PCR Screening     Status: Abnormal   Collection Time: 08/15/15  7:07 PM  Result Value Ref Range Status   MRSA by PCR POSITIVE (A) NEGATIVE Final    Comment:        The GeneXpert MRSA Assay (FDA approved for NASAL specimens only), is one component of a  comprehensive MRSA colonization surveillance program. It is not intended to diagnose MRSA infection nor to guide or monitor treatment for MRSA infections. CRITICAL RESULT CALLED TO, READ BACK BY AND VERIFIED WITH: DAWN SONGSTER AT 2058 08/15/15.PMH     Coagulation Studies: No results for input(s): LABPROT, INR in the last 72 hours.  Urinalysis:  Recent Labs  08/15/15 1546  COLORURINE STRAW*  LABSPEC 1.003*  PHURINE 9.0*  GLUCOSEU NEGATIVE  HGBUR 1+*  BILIRUBINUR NEGATIVE  KETONESUR NEGATIVE  PROTEINUR NEGATIVE  NITRITE NEGATIVE  LEUKOCYTESUR NEGATIVE      Imaging: Ct Abdomen Pelvis Wo Contrast  08/15/2015  CLINICAL DATA:  Lower abdominal pain with nausea, vomiting and diarrhea for 2 days, history COPD, CHF, MI, asthma, atrial fibrillation, smoker, liver disease EXAM: CT ABDOMEN AND  PELVIS WITHOUT CONTRAST TECHNIQUE: Multidetector CT imaging of the abdomen and pelvis was performed following the standard protocol without IV contrast. Sagittal and coronal MPR images reconstructed from axial data set. Patient drank dilute oral contrast for exam. COMPARISON:  None FINDINGS: Lower chest:  Dependent bibasilar atelectasis. Hepatobiliary: Calcified gallstones within gallbladder. Nodular cirrhotic appearing liver. No focal hepatic mass or definite biliary dilatation. Pancreas: Normal appearance Spleen: Enlarged, 17.6 cm length with calculated volume 1156 mL. No focal abnormalities. Adrenals/Urinary Tract: Adrenal glands normal appearance. BILATERAL atrophic kidneys. No gross evidence of renal mass, hydronephrosis, hydroureter, or urinary tract calcification. Unremarkable bladder. Minimal prostatic enlargement. Stomach/Bowel: Normal appendix. Scattered stool throughout colon. Stomach decompressed with suboptimal assessment of wall thickness. Small bowel loops unremarkable. No bowel dilatation, bowel wall thickening or evidence of obstruction. Small hiatal hernia with questionable wall thickening of distal esophagus, unable to exclude mass. 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 Vascular/Lymphatic: Scattered atherosclerotic calcification aorta. No adenopathy. Reproductive: N/A Other: No free air or free fluid. No hernia. Diffuse infiltrative change/edema involving sigmoid mesocolon, small bowel mesentery, pelvic soft tissue planes, and celiac axis. This is nonspecific but could reflect a combination of portal hypertension and hypoproteinemia as a result of cirrhosis. No discrete abscess or source of an acute inflammatory process is identified. Musculoskeletal: No acute osseous findings. 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 Cannot exclude esophageal wall thickening near a visualize small hiatal hernia; endoscopic follow-up recommended to exclude tumor.  Scattered edema within sigmoid mesocolon, pelvic soft tissue planes, small bowel mesenteries celiac axis question reflecting portal hypertension and potentially hypoproteinemia. Cholelithiasis. BILATERAL renal cortical atrophy. Electronically Signed   By: Lavonia Dana M.D.   On: 08/15/2015 17:50     Medications:     . aspirin EC  81 mg Oral Daily  . Chlorhexidine Gluconate Cloth  6 each Topical Q0600  . docusate  50 mg Oral BID  . folic acid  1 mg Oral Daily  . furosemide  80 mg Oral BID  . heparin subcutaneous  5,000 Units Subcutaneous Q8H  . metoprolol  50 mg Oral TID  . multivitamin  1 tablet Oral Daily  . mupirocin cream   Topical BID  . nicotine  21 mg Transdermal Daily  . sevelamer carbonate  800 mg Oral TID WC  . [START ON 08/18/2015] vancomycin  1,000 mg Intravenous Q T,Th,Sa-HD  . vitamin C  500 mg Oral Daily   acetaminophen **OR** acetaminophen, diphenhydrAMINE, HYDROcodone-acetaminophen, morphine injection, ondansetron **OR** ondansetron (ZOFRAN) IV, senna-docusate, traMADol  Assessment/ Plan:  Mr. Richard Herring is a 60 y.o. white male with End Stage Renal Disease on hemodialysis via left arm AVF since 2007, atrial fibrillation with pacemaker, COPD with ongoing tobacco use,  history of depression, history of fourniers gangrene.   TTS [facility].   1. End Stage Renal Disease: with dialysis access complication. Discussed case with Dr. Delana Meyer. Seems patient is able to be treated around the AVF. IV Vanco given at dialysis center.  - resume TTS schedule. Will prescribe vancomycin with HD treatment. Currently no growth on blood cultures.    2. Hypertension: blood pressure at goal.  - furosemide and metoprolol. Unclear if he is taking these medications at home.   3. Secondary Hyperparathyroidism: PTH 321 on 5/10 - Continue sevelamer for binding.   4. Anemia of chronic kidney disease: hemoglobin at goal.  - No indication for epo.   5. Lung Mass: spiculated lesion  on left upper lobe from CT from 5/9.    LOS: 1 Wynelle Dreier 6/25/201710:19 AM

## 2015-08-16 NOTE — Clinical Social Work Note (Addendum)
Clinical Social Work Assessment  Patient Details  Name: Richard Herring MRN: [id number] Date of Birth: 1955-04-06  Date of referral:  08/16/15               Reason for consult:  Facility Placement                Permission sought to share information with:  Facility Sport and exercise psychologist Permission granted to share information::  Yes, Verbal Permission Granted  Name::     [facility]  Contact Information:  [phone]  Housing/Transportation Living arrangements for the past 2 months:  [place] of Information:  Patient Patient Interpreter Needed:  None Criminal Activity/Legal Involvement Pertinent to Current Situation/Hospitalization:  No - Comment as needed Significant Relationships:  None Lives with:  Facility Resident Do you feel safe going back to the place where you live?  Yes Need for family participation in patient care:  No (Coment)  Care giving concerns:  No care giving concerns identified.   Social Worker assessment / plan:  CSW met with pt address consult. CSW introduced herself and explained role of social work. CSW also explained process of discharging and returning to group home. Pt is from [facility] and pt is able to return at discharge. CSW updated facility and discharge information is in packet. CSW also explained the information that was provided. [facility], Scientist, research (life sciences), and the [place] for advocacy. CSW will also refer pt to [place] Patient Service Navigator for further assistance through the Cancer CenterPt reported that he has been working with his dialysis social work for assistance as well. Facility made arrangements for transportation home. Pt is ready for discharge. CSW is signing off as no further needs identified.   Employment status:  Unemployed, Disabled (Comment on whether or not currently receiving Disability) (Awaiting SSI/Medicaid) Insurance information:  Self Pay (Medicaid Pending) PT Recommendations:  Not assessed at this  time Information / Referral to community resources:  Other (Comment Required) ([facility]HA, Legal Aid, and the Ombudsman)  Patient/Family's Response to care:  Pt was appreciative of CSW support.   Patient/Family's Understanding of and Emotional Response to Diagnosis, Current Treatment, and Prognosis:  Pt is returning to facility, and will be following up with social worker at HD center.   Emotional Assessment Appearance:  Appears stated age Attitude/Demeanor/Rapport:  Other (Appropriate) Affect (typically observed):  Accepting, Adaptable, Pleasant Orientation:  Oriented to Self, Oriented to Place, Oriented to  Time, Oriented to Situation Alcohol / Substance use:  Other Psych involvement (Current and /or in the community):  No (Comment)  Discharge Needs  Concerns to be addressed:  Other (Comment Required (Resources provided) Readmission within the last 30 days:  No Current discharge risk:  Chronically ill Barriers to Discharge:  No Barriers Identified   Darden Dates, LCSW 08/16/2015, 2:18 PM

## 2015-08-16 NOTE — Discharge Instructions (Signed)
Dr Rolly Salter to set up iv vancomycin with dialysis for one week Need for follow up at the cancer center for spiculated lung nodule

## 2015-08-16 NOTE — Discharge Summary (Signed)
[facility] NAME: Richard Herring    MR#:  [id number]  DATE OF BIRTH:  1955-04-08  DATE OF ADMISSION:  08/15/2015 ADMITTING PHYSICIAN: Bettey Costa, MD  DATE OF DISCHARGE: 08/16/2015  PRIMARY CARE PHYSICIAN: Murlean Iba, MD    ADMISSION DIAGNOSIS:  Lower abdominal pain [R10.30] Nausea vomiting and diarrhea [R11.2, R19.7] AV fistula infection, initial encounter ([place]) [T82.7XXA]  DISCHARGE DIAGNOSIS:  Active Problems:   Infection of AV graft for dialysis [facility])   SECONDARY DIAGNOSIS:   Past Medical History  Diagnosis Date  . Renal disorder   . COPD (chronic obstructive pulmonary disease) ([place])   . A-fib ([place])   . Anxiety   . GERD (gastroesophageal reflux disease)   . Arthritis   . Asthma   . Depression   . Gout   . Heart attack ([place])   . Congestive heart failure ([place])   . Heart disease   . Kidney failure   . Liver disease   . Stomach ulcer     HOSPITAL COURSE:   1. Infection on dialysis graft left arm. Patient states a little pus is come out of it. He has scabs on that arm. He was started on IV vancomycin here in the hospital. Blood cultures are negative. I spoke with Dr. Rolly Salter nephrology and he will set up IV vancomycin with dialysis. I did not write the patient a prescription for this since this will be set up with dialysis. He discussed the case with vascular surgery and they can follow-up as outpatient. 2. Spiculated lung nodule seen on CT scan back in May. Patient will need an outpatient follow-up with oncology at the cancer center. I recommended an outpatient PET scan. Further management will be based on PET scan results. This could be a cancerous nodule needs to be followed up. 3. End-stage renal disease on dialysis Tuesday Thursday and Saturday as per nephrology 4. History of cirrhosis with hepatitis C 5. History of atrial fibrillation. On aspirin only for anticoagulation and metoprolol for rate control 6.  History of congestive heart failure no signs of his hospital stay.   DISCHARGE CONDITIONS:   Satisfactory  CONSULTS OBTAINED:  Treatment Team:  Lavonia Dana, MD Vilinda Boehringer, MD  DRUG ALLERGIES:  No Known Allergies  DISCHARGE MEDICATIONS:   Current Discharge Medication List    START taking these medications   Details  vancomycin (VANCOCIN) 1-5 GM/200ML-% SOLN Inject 200 mLs (1,000 mg total) into the vein Every Tuesday,Thursday,and Saturday with dialysis. Qty: 4000 mL      CONTINUE these medications which have NOT CHANGED   Details  ascorbic acid (VITAMIN C) 500 MG tablet Take 500 mg by mouth daily.    aspirin EC 81 MG tablet Take 81 mg by mouth daily. Reported on 08/06/2015    b complex vitamins capsule Take 1 capsule by mouth daily.    docusate sodium (COLACE) 50 MG capsule Take 50 mg by mouth 2 (two) times daily.    folic acid (FOLVITE) 1 MG tablet Take 1 mg by mouth daily.    furosemide (LASIX) 40 MG tablet Take 80 mg by mouth 2 (two) times daily.    metoprolol (LOPRESSOR) 50 MG tablet Take 50 mg by mouth 3 (three) times daily. Reported on 08/06/2015    pramoxine (PROCTOFOAM) 1 % foam Place 1 application rectally 3 (three) times daily as needed for itching.    ranitidine (ZANTAC) 300 MG tablet Take 300 mg by mouth 2 (two) times daily.  sevelamer carbonate (RENVELA) 800 MG tablet Take 800 mg by mouth 3 (three) times daily with meals.    traMADol (ULTRAM) 50 MG tablet Take 50 mg by mouth every 8 (eight) hours as needed.         DISCHARGE INSTRUCTIONS:   Follow-up with dialysis as scheduled and nephrologist Follow-up at the cancer center for evaluation of pulmonary nodule  If you experience worsening of your admission symptoms, develop shortness of breath, life threatening emergency, suicidal or homicidal thoughts you must seek medical attention immediately by calling 911 or calling your MD immediately  if symptoms less severe.  You Must read complete  instructions/literature along with all the possible adverse reactions/side effects for all the Medicines you take and that have been prescribed to you. Take any new Medicines after you have completely understood and accept all the possible adverse reactions/side effects.   Please note  You were cared for by a hospitalist during your hospital stay. If you have any questions about your discharge medications or the care you received while you were in the hospital after you are discharged, you can call the unit and asked to speak with the hospitalist on call if the hospitalist that took care of you is not available. Once you are discharged, your primary care physician will handle any further medical issues. Please note that NO REFILLS for any discharge medications will be authorized once you are discharged, as it is imperative that you return to your primary care physician (or establish a relationship with a primary care physician if you do not have one) for your aftercare needs so that they can reassess your need for medications and monitor your lab values.    Today   CHIEF COMPLAINT:   Chief Complaint  Patient presents with  . Weakness    HISTORY OF PRESENT ILLNESS:  Richard Herring  is a 60 y.o. male Sent in for infection on the left arm graft   VITAL SIGNS:  Blood pressure 105/68, pulse 73, temperature 98.1 F (36.7 C), temperature source Oral, resp. rate 16, weight 82.101 kg (181 lb), SpO2 100 %.    PHYSICAL EXAMINATION:  GENERAL:  60 y.o.-year-old patient lying in the bed with no acute distress.  EYES: Pupils equal, round, reactive to light and accommodation. No scleral icterus. Extraocular muscles intact.  HEENT: Head atraumatic, normocephalic. Oropharynx and nasopharynx clear.  NECK:  Supple, no jugular venous distention. No thyroid enlargement, no tenderness.  LUNGS: Normal breath sounds bilaterally, no wheezing, rales,rhonchi or crepitation. No use of accessory muscles of  respiration.  CARDIOVASCULAR: S1, S2 normal. No murmurs, rubs, or gallops.  ABDOMEN: Soft, non-tender, non-distended. Bowel sounds present. No organomegaly or mass.  EXTREMITIES: No pedal edema, cyanosis, or clubbing.  NEUROLOGIC: Cranial nerves II through XII are intact. Muscle strength 5/5 in all extremities. Sensation intact. Gait not checked.  PSYCHIATRIC: The patient is alert and oriented x 3.  SKIN: Over left arm graft did see some scabs and slight erythema   DATA REVIEW:   CBC  Recent Labs Lab 08/16/15 0443  WBC 2.7*  HGB 12.1*  HCT 34.9*  PLT 55*    Chemistries   Recent Labs Lab 08/15/15 1546 08/16/15 0443  NA 137 137  K 3.0* 3.4*  CL 99* 100*  CO2 28 26  GLUCOSE 83 88  BUN 27* 36*  CREATININE 4.35* 5.67*  CALCIUM 8.0* 8.1*  AST 75*  --   ALT 91*  --   ALKPHOS 72  --  BILITOT 0.4  --     Cardiac Enzymes  Recent Labs Lab 08/15/15 1546  TROPONINI <0.03    Microbiology Results  Results for orders placed or performed during the hospital encounter of 08/15/15  Culture, blood (Routine X 2) w Reflex to ID Panel     Status: None (Preliminary result)   Collection Time: 08/15/15  4:41 PM  Result Value Ref Range Status   Specimen Description BLOOD RIGHT HAND  Final   Special Requests   Final    BOTTLES DRAWN AEROBIC AND ANAEROBIC  AEROBIC 8CC, ANAEROBIC 4CC   Culture NO GROWTH < 24 HOURS  Final   Report Status PENDING  Incomplete  Culture, blood (Routine X 2) w Reflex to ID Panel     Status: None (Preliminary result)   Collection Time: 08/15/15  4:42 PM  Result Value Ref Range Status   Specimen Description BLOOD RIGHT ASSIST CONTROL  Final   Special Requests BOTTLES DRAWN AEROBIC AND ANAEROBIC  4CC  Final   Culture NO GROWTH < 24 HOURS  Final   Report Status PENDING  Incomplete  MRSA PCR Screening     Status: Abnormal   Collection Time: 08/15/15  7:07 PM  Result Value Ref Range Status   MRSA by PCR POSITIVE (A) NEGATIVE Final    Comment:        The  GeneXpert MRSA Assay (FDA approved for NASAL specimens only), is one component of a comprehensive MRSA colonization surveillance program. It is not intended to diagnose MRSA infection nor to guide or monitor treatment for MRSA infections. CRITICAL RESULT CALLED TO, READ BACK BY AND VERIFIED WITH: DAWN SONGSTER AT 2058 08/15/15.PMH     RADIOLOGY:  Ct Abdomen Pelvis Wo Contrast  08/15/2015  CLINICAL DATA:  Lower abdominal pain with nausea, vomiting and diarrhea for 2 days, history COPD, CHF, MI, asthma, atrial fibrillation, smoker, liver disease EXAM: CT ABDOMEN AND PELVIS WITHOUT CONTRAST TECHNIQUE: Multidetector CT imaging of the abdomen and pelvis was performed following the standard protocol without IV contrast. Sagittal and coronal MPR images reconstructed from axial data set. Patient drank dilute oral contrast for exam. COMPARISON:  None FINDINGS: Lower chest:  Dependent bibasilar atelectasis. Hepatobiliary: Calcified gallstones within gallbladder. Nodular cirrhotic appearing liver. No focal hepatic mass or definite biliary dilatation. Pancreas: Normal appearance Spleen: Enlarged, 17.6 cm length with calculated volume 1156 mL. No focal abnormalities. Adrenals/Urinary Tract: Adrenal glands normal appearance. BILATERAL atrophic kidneys. No gross evidence of renal mass, hydronephrosis, hydroureter, or urinary tract calcification. Unremarkable bladder. Minimal prostatic enlargement. Stomach/Bowel: Normal appendix. Scattered stool throughout colon. Stomach decompressed with suboptimal assessment of wall thickness. Small bowel loops unremarkable. No bowel dilatation, bowel wall thickening or evidence of obstruction. Small hiatal hernia with questionable wall thickening of distal esophagus, unable to exclude mass. 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 Vascular/Lymphatic: Scattered atherosclerotic calcification aorta. No adenopathy. Reproductive: N/A Other: No free air or free fluid. No hernia. Diffuse  infiltrative change/edema involving sigmoid mesocolon, small bowel mesentery, pelvic soft tissue planes, and celiac axis. This is nonspecific but could reflect a combination of portal hypertension and hypoproteinemia as a result of cirrhosis. No discrete abscess or source of an acute inflammatory process is identified. Musculoskeletal: No acute osseous findings. 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 Cannot exclude esophageal wall thickening near a visualize small hiatal hernia; endoscopic follow-up recommended to exclude tumor. Scattered edema within sigmoid mesocolon, pelvic soft tissue planes, small bowel mesenteries celiac axis question reflecting portal hypertension and potentially hypoproteinemia. Cholelithiasis.  BILATERAL renal cortical atrophy. Electronically Signed   By: Lavonia Dana M.D.   On: 08/15/2015 17:50    Management plans discussed with the patient, and nephrologist and they are in agreement.  CODE STATUS:     Code Status Orders        Start     Ordered   08/15/15 1755  Full code   Continuous     08/15/15 1754    Code Status History    Date Active Date Inactive Code Status Order ID Comments User Context   07/01/2015  5:01 PM 07/02/2015  9:56 PM Full Code [id number]  Nicholes Mango, MD Inpatient      TOTAL TIME TAKING CARE OF THIS PATIENT: 35 minutes.    Loletha Grayer M.D on 08/16/2015 at 12:16 PM  Between 7am to 6pm - Pager - [phone]  After 6pm go to [email or web address] - password [email or web address]  [employer] Office  [phone]  CC: Primary care physician; Murlean Iba, MD

## 2015-08-16 NOTE — Consult Note (Addendum)
PULMONARY / CRITICAL CARE MEDICINE   Name: Richard Herring MRN: [id number] DOB: 1955-08-16    ADMISSION DATE:  08/15/2015  Consulting Physician - Dr. Juleen China  HISTORY: 60 year old male with past history of COPD, end-stage disease on hemodialysis, recent left upper lobe spiculated lung nodule found on imaging, tobacco abuser, admitted to the hospital for upper extremity pain. Patient has a left AV fistula that had purulent discharge and pain. Patient states this is been going on for several weeks, he noted some green-yellow discharge yesterday. He denies any fevers or chills. He does admit to generalized weakness. Records showed on 06/30/2015 he was admitted for shortness of breath initially attributed to volume overload from missed dialysis session, his chest x-ray showed abnormal lesion in the left upper lobe this was followed up with a CT chest that showed a spiculated left upper lobe lesion. Smoked 1.5ppd x 40 years, formerly incarcerated for 10years, ppd negative per patient.    STUDIES:  CT chest w/o contrast 06/30/15- elongated spiculated nodular lesion in left upper lobe measures at least 2.1 x 1.5 cm. Emphysematous changes bilaterally.    VITAL SIGNS: Temp:  [97.8 F (36.6 C)-98.4 F (36.9 C)] 98.1 F (36.7 C) (06/25 0802) Pulse Rate:  [49-90] 73 (06/25 0802) Resp:  [16-20] 16 (06/25 0802) BP: (94-122)/(56-75) 105/68 mmHg (06/25 0915) SpO2:  [98 %-100 %] 100 % (06/25 0802) Weight:  [181 lb (82.101 kg)] 181 lb (82.101 kg) (06/24 2017) HEMODYNAMICS:   VENTILATOR SETTINGS:   INTAKE / OUTPUT: No intake or output data in the 24 hours ending 08/16/15 1040  Review of Systems  Constitutional: Negative for fever and chills.  Eyes: Negative for blurred vision and double vision.  Respiratory: Positive for cough and sputum production. Negative for shortness of breath and wheezing.        Chronic cough with white sputum production.   Cardiovascular: Negative for chest pain.   Gastrointestinal: Positive for nausea and vomiting.  Musculoskeletal: Positive for joint pain. Negative for falls.  Skin: Negative for rash.  Neurological: Negative for headaches.  Endo/Heme/Allergies: Does not bruise/bleed easily.  Psychiatric/Behavioral: Negative for depression.    Physical Exam  Constitutional: He is oriented to person, place, and time and well-developed, well-nourished, and in no distress.  HENT:  Head: Normocephalic and atraumatic.  Right Ear: External ear normal.  Left Ear: External ear normal.  Mouth/Throat: Oropharynx is clear and moist.  Eyes: Conjunctivae are normal. Pupils are equal, round, and reactive to light.  Neck: Normal range of motion.  Cardiovascular: Normal rate.   Pulmonary/Chest: Effort normal. No respiratory distress. He has no wheezes. He has no rales.  Abdominal: Soft. Bowel sounds are normal. He exhibits no distension. There is no tenderness.  Musculoskeletal: Normal range of motion.  LUE AVF - no purulent discharge.   Neurological: He is alert and oriented to person, place, and time.  Skin: Skin is warm and dry.  Nursing note and vitals reviewed.    LABS:  CBC  Recent Labs Lab 08/15/15 1546 08/16/15 0443  WBC 2.3* 2.7*  HGB 11.7* 12.1*  HCT 34.1* 34.9*  PLT 53* 55*   Coag's No results for input(s): APTT, INR in the last 168 hours. BMET  Recent Labs Lab 08/15/15 1546 08/16/15 0443  NA 137 137  K 3.0* 3.4*  CL 99* 100*  CO2 28 26  BUN 27* 36*  CREATININE 4.35* 5.67*  GLUCOSE 83 88   Electrolytes  Recent Labs Lab 08/15/15 1546 08/16/15 0443  CALCIUM 8.0* 8.1*  Sepsis Markers No results for input(s): LATICACIDVEN, PROCALCITON, O2SATVEN in the last 168 hours. ABG No results for input(s): PHART, PCO2ART, PO2ART in the last 168 hours. Liver Enzymes  Recent Labs Lab 08/15/15 1546  AST 75*  ALT 91*  ALKPHOS 72  BILITOT 0.4  ALBUMIN 3.2*   Cardiac Enzymes  Recent Labs Lab 08/15/15 1546   TROPONINI <0.03   Glucose No results for input(s): GLUCAP in the last 168 hours.  Imaging Ct Abdomen Pelvis Wo Contrast  08/15/2015  CLINICAL DATA:  Lower abdominal pain with nausea, vomiting and diarrhea for 2 days, history COPD, CHF, MI, asthma, atrial fibrillation, smoker, liver disease EXAM: CT ABDOMEN AND PELVIS WITHOUT CONTRAST TECHNIQUE: Multidetector CT imaging of the abdomen and pelvis was performed following the standard protocol without IV contrast. Sagittal and coronal MPR images reconstructed from axial data set. Patient drank dilute oral contrast for exam. COMPARISON:  None FINDINGS: Lower chest:  Dependent bibasilar atelectasis. Hepatobiliary: Calcified gallstones within gallbladder. Nodular cirrhotic appearing liver. No focal hepatic mass or definite biliary dilatation. Pancreas: Normal appearance Spleen: Enlarged, 17.6 cm length with calculated volume 1156 mL. No focal abnormalities. Adrenals/Urinary Tract: Adrenal glands normal appearance. BILATERAL atrophic kidneys. No gross evidence of renal mass, hydronephrosis, hydroureter, or urinary tract calcification. Unremarkable bladder. Minimal prostatic enlargement. Stomach/Bowel: Normal appendix. Scattered stool throughout colon. Stomach decompressed with suboptimal assessment of wall thickness. Small bowel loops unremarkable. No bowel dilatation, bowel wall thickening or evidence of obstruction. Small hiatal hernia with questionable wall thickening of distal esophagus, unable to exclude mass. 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 Vascular/Lymphatic: Scattered atherosclerotic calcification aorta. No adenopathy. Reproductive: N/A Other: No free air or free fluid. No hernia. Diffuse infiltrative change/edema involving sigmoid mesocolon, small bowel mesentery, pelvic soft tissue planes, and celiac axis. This is nonspecific but could reflect a combination of portal hypertension and hypoproteinemia as a result of cirrhosis. No discrete abscess or source  of an acute inflammatory process is identified. Musculoskeletal: No acute osseous findings. 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 Cannot exclude esophageal wall thickening near a visualize small hiatal hernia; endoscopic follow-up recommended to exclude tumor. Scattered edema within sigmoid mesocolon, pelvic soft tissue planes, small bowel mesenteries celiac axis question reflecting portal hypertension and potentially hypoproteinemia. Cholelithiasis. BILATERAL renal cortical atrophy. Electronically Signed   By: Lavonia Dana M.D.   On: 08/15/2015 17:50    LINES:   CULTURES:  ANTIBIOTICS  ASSESSMENT / PLAN: 60 year old male with history of COPD, end-stage renal disease on hemodialysis, tobacco abuse, now with left upper lobe spiculated lesion.  COPD Left upper lobe lesion Tobacco abuse End-stage renal disease LV AFV infection  Nausea Vomiting Generalized weakness  Plan: -Patient is a current smoker, left upper lobe lesion is highly suspicious for malignancy given size and risk factors of spiculated lesion, size, upper lobe predominance, and a current tobacco user with greater than 30-pack-year history. -Patient will need further evaluation with a repeat CT chest which can be done as an outpatient. -His access for his end-stage renal disease and other medical problems will need to be taking care of before he can have any kind of bronchoscopic procedure. -Clinically may have COPD, but could not locate any records for PFTs or spirometry, his COPD can be worked up as an outpatient. - tobacco cessation counseling provided > 46mns  Patient will follow-up in pulmonary clinic, pulmonary clinic will call patient and scheduled follow-up appointment.   Thank you for consulting [facility], we will signoff at  this time.  Please feel free to contact us with any questions at [phone] (please enter  7-digits).    Thank you for consulting [facility], Please feel free to contacts [place] with any questions at [phone] (please enter 7-digits).  I have personally obtained a history, examined the patient, evaluated laboratory and imaging results, formulated the assessment and plan and placed orders.  Pulmonary Consult Time devoted to patient care services described in this note is 45 minutes.    Vilinda Boehringer, MD [facility] Pager [phone] (please enter 7-digits) On Call Pager [phone] (please enter 7-digits)  Note: This note was prepared with Dragon dictation along with smaller phrase technology. Any transcriptional errors that result from this process are unintentional.

## 2015-08-16 NOTE — NC FL2 (Signed)
[place] LEVEL OF CARE SCREENING TOOL     IDENTIFICATION  Patient Name: Richard Herring Birthdate: [birth year] Sex: male Admission Date (Current Location): 08/15/2015  [place] and [place] Number:  Engineering geologist and Address:  [facility], [address], [place], [place] [address]      Provider Number: [id number]  Attending Physician Name and Address:  Loletha Grayer, MD  Relative Name and Phone Number:       Current Level of Care: Hospital Recommended Level of Care: [employer] Prior Approval Number:    Date Approved/Denied:   PASRR Number:    Discharge Plan:      Current Diagnoses: Patient Active Problem List   Diagnosis Date Noted  . Infection of AV graft for dialysis ([place]) 08/15/2015  . History of Fournier's gangrene 08/11/2015  . Acute gastroenteritis 07/01/2015    Orientation RESPIRATION BLADDER Height & Weight     Self, Time, Situation, Place  Normal Continent Weight: 181 lb (82.101 kg) Height:     BEHAVIORAL SYMPTOMS/MOOD NEUROLOGICAL BOWEL NUTRITION STATUS      Continent Diet (Renal Diet, Fluid Restrictions 1259m)  AMBULATORY STATUS COMMUNICATION OF NEEDS Skin   Independent Verbally Normal                       Personal Care Assistance Level of Assistance  Bathing, Feeding, Dressing Bathing Assistance: Independent Feeding assistance: Independent Dressing Assistance: Independent     Functional Limitations Info  Sight, Hearing, Speech Sight Info: Adequate Hearing Info: Adequate Speech Info: Adequate    SPECIAL CARE FACTORS FREQUENCY                       Contractures      Additional Factors Info  Code Status, Allergies Code Status Info: Full Code Allergies Info: No known allergies           Discharge Medications: Please see discharge summary for a list of discharge medications.  Current Discharge Medication List    START taking these medications   Details   vancomycin (VANCOCIN) 1-5 GM/200ML-% SOLN Inject 200 mLs (1,000 mg total) into the vein Every Tuesday,Thursday,and Saturday with dialysis. Qty: 4000 mL      CONTINUE these medications which have NOT CHANGED   Details  ascorbic acid (VITAMIN C) 500 MG tablet Take 500 mg by mouth daily.    aspirin EC 81 MG tablet Take 81 mg by mouth daily. Reported on 08/06/2015    b complex vitamins capsule Take 1 capsule by mouth daily.    docusate sodium (COLACE) 50 MG capsule Take 50 mg by mouth 2 (two) times daily.    folic acid (FOLVITE) 1 MG tablet Take 1 mg by mouth daily.    furosemide (LASIX) 40 MG tablet Take 80 mg by mouth 2 (two) times daily.    metoprolol (LOPRESSOR) 50 MG tablet Take 50 mg by mouth 3 (three) times daily. Reported on 08/06/2015    pramoxine (PROCTOFOAM) 1 % foam Place 1 application rectally 3 (three) times daily as needed for itching.    ranitidine (ZANTAC) 300 MG tablet Take 300 mg by mouth 2 (two) times daily.    sevelamer carbonate (RENVELA) 800 MG tablet Take 800 mg by mouth 3 (three) times daily with meals.    traMADol (ULTRAM) 50 MG tablet Take 50 mg by mouth every 8 (eight) hours as needed.           Relevant Imaging  Results:  Relevant Lab Results:   Additional Information HD:  Tuesday, Thursday, Saturday [place]. Per MD, pt will get vanc during HD.   Darden Dates, LCSW

## 2015-08-17 ENCOUNTER — Telehealth: Payer: Self-pay | Admitting: *Deleted

## 2015-08-17 NOTE — Telephone Encounter (Signed)
Tried to call pt but got no answer and no VM available. Will try again later.

## 2015-08-17 NOTE — Telephone Encounter (Signed)
-----   Message from Vilinda Boehringer, MD sent at 08/16/2015 11:19 AM EDT ----- Regarding: HFU Please schedule HFU with me in 2-3 weeks. LUL lung lesion. Patient cell number is [phone].  He states that he has a Education officer, museum that handles all is medical appointment, etc.  He is on assisted care, recently released from prison.  We will need most recent ppd records from his dialysis center.  Thanks VM

## 2015-08-19 NOTE — Telephone Encounter (Signed)
hosp f/u appt scheduled. Nothing further needed.

## 2015-08-20 ENCOUNTER — Encounter: Payer: Self-pay | Admitting: Gastroenterology

## 2015-08-20 LAB — CULTURE, BLOOD (ROUTINE X 2)
CULTURE: NO GROWTH
Culture: NO GROWTH

## 2015-08-21 ENCOUNTER — Ambulatory Visit
Admission: RE | Admit: 2015-08-21 | Discharge: 2015-08-21 | Disposition: A | Discharge status: Home / Self Care | Payer: Medicaid Other | Source: Ambulatory Visit | Attending: Vascular Surgery | Admitting: Vascular Surgery

## 2015-08-21 ENCOUNTER — Encounter
Admission: RE | Disposition: A | Discharge status: Home / Self Care | Payer: Self-pay | Source: Ambulatory Visit | Attending: Vascular Surgery

## 2015-08-21 ENCOUNTER — Other Ambulatory Visit: Payer: Self-pay | Admitting: Vascular Surgery

## 2015-08-21 DIAGNOSIS — M109 Gout, unspecified: Secondary | ICD-10-CM | POA: Diagnosis not present

## 2015-08-21 DIAGNOSIS — F419 Anxiety disorder, unspecified: Secondary | ICD-10-CM | POA: Insufficient documentation

## 2015-08-21 DIAGNOSIS — I132 Hypertensive heart and chronic kidney disease with heart failure and with stage 5 chronic kidney disease, or end stage renal disease: Secondary | ICD-10-CM | POA: Insufficient documentation

## 2015-08-21 DIAGNOSIS — K219 Gastro-esophageal reflux disease without esophagitis: Secondary | ICD-10-CM | POA: Diagnosis not present

## 2015-08-21 DIAGNOSIS — T82858A Stenosis of vascular prosthetic devices, implants and grafts, initial encounter: Secondary | ICD-10-CM | POA: Insufficient documentation

## 2015-08-21 DIAGNOSIS — I509 Heart failure, unspecified: Secondary | ICD-10-CM | POA: Diagnosis not present

## 2015-08-21 DIAGNOSIS — F17210 Nicotine dependence, cigarettes, uncomplicated: Secondary | ICD-10-CM | POA: Insufficient documentation

## 2015-08-21 DIAGNOSIS — Y832 Surgical operation with anastomosis, bypass or graft as the cause of abnormal reaction of the patient, or of later complication, without mention of misadventure at the time of the procedure: Secondary | ICD-10-CM | POA: Insufficient documentation

## 2015-08-21 DIAGNOSIS — J449 Chronic obstructive pulmonary disease, unspecified: Secondary | ICD-10-CM | POA: Insufficient documentation

## 2015-08-21 DIAGNOSIS — M1990 Unspecified osteoarthritis, unspecified site: Secondary | ICD-10-CM | POA: Insufficient documentation

## 2015-08-21 DIAGNOSIS — K769 Liver disease, unspecified: Secondary | ICD-10-CM | POA: Diagnosis not present

## 2015-08-21 DIAGNOSIS — I2510 Atherosclerotic heart disease of native coronary artery without angina pectoris: Secondary | ICD-10-CM | POA: Insufficient documentation

## 2015-08-21 DIAGNOSIS — N186 End stage renal disease: Secondary | ICD-10-CM | POA: Insufficient documentation

## 2015-08-21 DIAGNOSIS — Z992 Dependence on renal dialysis: Secondary | ICD-10-CM | POA: Diagnosis not present

## 2015-08-21 DIAGNOSIS — D61818 Other pancytopenia: Secondary | ICD-10-CM | POA: Diagnosis not present

## 2015-08-21 DIAGNOSIS — F329 Major depressive disorder, single episode, unspecified: Secondary | ICD-10-CM | POA: Insufficient documentation

## 2015-08-21 DIAGNOSIS — I4891 Unspecified atrial fibrillation: Secondary | ICD-10-CM | POA: Insufficient documentation

## 2015-08-21 DIAGNOSIS — I252 Old myocardial infarction: Secondary | ICD-10-CM | POA: Insufficient documentation

## 2015-08-21 DIAGNOSIS — Z95810 Presence of automatic (implantable) cardiac defibrillator: Secondary | ICD-10-CM | POA: Diagnosis not present

## 2015-08-21 HISTORY — PX: PERIPHERAL VASCULAR CATHETERIZATION: SHX172C

## 2015-08-21 LAB — CBC
HCT: 33.2 % — ABNORMAL LOW (ref 40.0–52.0)
Hemoglobin: 11.3 g/dL — ABNORMAL LOW (ref 13.0–18.0)
MCH: 34 pg (ref 26.0–34.0)
MCHC: 34.1 g/dL (ref 32.0–36.0)
MCV: 99.9 fL (ref 80.0–100.0)
Platelets: 49 10*3/uL — ABNORMAL LOW (ref 150–440)
RBC: 3.32 MIL/uL — ABNORMAL LOW (ref 4.40–5.90)
RDW: 15 % — AB (ref 11.5–14.5)
WBC: 1.8 10*3/uL — ABNORMAL LOW (ref 3.8–10.6)

## 2015-08-21 LAB — POTASSIUM (ARMC VASCULAR LAB ONLY): POTASSIUM (ARMC VASCULAR LAB): 4.1 (ref 3.5–5.1)

## 2015-08-21 SURGERY — A/V SHUNTOGRAM/FISTULAGRAM
Anesthesia: Moderate Sedation

## 2015-08-21 MED ORDER — MIDAZOLAM HCL 2 MG/2ML IJ SOLN
INTRAMUSCULAR | Status: DC | PRN
  Administered 2015-08-21 (×2): 1 mg via INTRAVENOUS

## 2015-08-21 MED ORDER — LIDOCAINE HCL (PF) 1 % IJ SOLN
INTRAMUSCULAR | Status: AC
  Filled 2015-08-21: qty 30

## 2015-08-21 MED ORDER — DEXTROSE 5 % IV SOLN
1.5000 g | INTRAVENOUS | Status: AC
  Administered 2015-08-21: 1.5 g via INTRAVENOUS

## 2015-08-21 MED ORDER — IOPAMIDOL (ISOVUE-300) INJECTION 61%
INTRAVENOUS | Status: DC | PRN
  Administered 2015-08-21: 20 mL via INTRAVENOUS

## 2015-08-21 MED ORDER — ONDANSETRON HCL 4 MG/2ML IJ SOLN: 4.0000 mg | Freq: Four times a day (QID) | INTRAMUSCULAR | Status: DC | PRN

## 2015-08-21 MED ORDER — SODIUM CHLORIDE 0.9 % IV SOLN
INTRAVENOUS | Status: DC
  Administered 2015-08-21: 12:00:00 via INTRAVENOUS

## 2015-08-21 MED ORDER — HEPARIN (PORCINE) IN NACL 2-0.9 UNIT/ML-% IJ SOLN
INTRAMUSCULAR | Status: AC
  Filled 2015-08-21: qty 1000

## 2015-08-21 MED ORDER — FENTANYL CITRATE (PF) 100 MCG/2ML IJ SOLN
INTRAMUSCULAR | Status: AC
  Filled 2015-08-21: qty 2

## 2015-08-21 MED ORDER — FAMOTIDINE 20 MG PO TABS: 40.0000 mg | ORAL_TABLET | ORAL | Status: DC | PRN

## 2015-08-21 MED ORDER — HEPARIN SODIUM (PORCINE) 1000 UNIT/ML IJ SOLN
INTRAMUSCULAR | Status: AC
  Filled 2015-08-21: qty 1

## 2015-08-21 MED ORDER — FENTANYL CITRATE (PF) 100 MCG/2ML IJ SOLN
INTRAMUSCULAR | Status: DC | PRN
  Administered 2015-08-21 (×2): 50 ug via INTRAVENOUS

## 2015-08-21 MED ORDER — METHYLPREDNISOLONE SODIUM SUCC 125 MG IJ SOLR: 125.0000 mg | INTRAMUSCULAR | Status: DC | PRN

## 2015-08-21 MED ORDER — MIDAZOLAM HCL 2 MG/2ML IJ SOLN
INTRAMUSCULAR | Status: AC
  Filled 2015-08-21: qty 4

## 2015-08-21 MED ORDER — HYDROMORPHONE HCL 1 MG/ML IJ SOLN: 1.0000 mg | Freq: Once | INTRAMUSCULAR | Status: DC

## 2015-08-21 SURGICAL SUPPLY — 5 items
DRAPE BRACHIAL (DRAPES) ×4 IMPLANT
PACK ANGIOGRAPHY (CUSTOM PROCEDURE TRAY) ×4 IMPLANT
SET INTRO CAPELLA COAXIAL (SET/KITS/TRAYS/PACK) ×4 IMPLANT
SHEATH BRITE TIP 6FRX5.5 (SHEATH) ×4 IMPLANT
TOWEL OR 17X26 4PK STRL BLUE (TOWEL DISPOSABLE) ×4 IMPLANT

## 2015-08-21 NOTE — Discharge Instructions (Signed)
Fistulogram, Care After °Refer to this sheet in the next few weeks. These instructions provide you with information on caring for yourself after your procedure. Your health care provider may also give you more specific instructions. Your treatment has been planned according to current medical practices, but problems sometimes occur. Call your health care provider if you have any problems or questions after your procedure. °WHAT TO EXPECT AFTER THE PROCEDURE °After your procedure, it is typical to have the following: °· A small amount of discomfort in the area where the catheters were placed. °· A small amount of bruising around the fistula. °· Sleepiness and fatigue. °HOME CARE INSTRUCTIONS °· Rest at home for the day following your procedure. °· Do not drive or operate heavy machinery while taking pain medicine. °· Take medicines only as directed by your health care provider. °· Do not take baths, swim, or use a hot tub until your health care provider approves. You may shower 24 hours after the procedure or as directed by your health care provider. °· There are many different ways to close and cover an incision, including stitches, skin glue, and adhesive strips. Follow your health care provider's instructions on: °¨ Incision care. °¨ Bandage (dressing) changes and removal. °¨ Incision closure removal. °· Monitor your dialysis fistula carefully. °SEEK MEDICAL CARE IF: °· You have drainage, redness, swelling, or pain at your catheter site. °· You have a fever. °· You have chills. °SEEK IMMEDIATE MEDICAL CARE IF: °· You feel weak. °· You have trouble balancing. °· You have trouble moving your arms or legs. °· You have problems with your speech or vision. °· You can no longer feel a vibration or buzz when you put your fingers over your dialysis fistula. °· The limb that was used for the procedure: °¨ Swells. °¨ Is painful. °¨ Is cold. °¨ Is discolored, such as blue or pale white. °  °This information is not intended  to replace advice given to you by your health care provider. Make sure you discuss any questions you have with your health care provider. °  °Document Released: 06/24/2013 Document Reviewed: 06/24/2013 °Elsevier Interactive Patient Education ©2016 [employer] ° °

## 2015-08-21 NOTE — H&P (Signed)
[facility] Admission History & Physical  MRN : [id number]  Richard Herring is a 60 y.o. 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  History of Present Illness: Patient is referred to me by Dr. 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 He denies fever chills. He has not missed any dialysis as they have been using the fistula above and below the aneurysmal segment.  No current facility-administered medications for this encounter.    Past Medical History  Diagnosis Date  . Renal disorder   . COPD (chronic obstructive pulmonary disease) ([place])   . A-fib ([place])   . Anxiety   . GERD (gastroesophageal reflux disease)   . Arthritis   . Asthma   . Depression   . Gout   . Heart attack ([place])   . Congestive heart failure ([place])   . Heart disease   . Kidney failure   . Liver disease   . Stomach ulcer     Past Surgical History  Procedure Laterality Date  . Peritoneal wound drainage  2015,2016    gangreen    . Cardiac defibrillator placement  2015  . Dialysis fistula creation  2014    Social History Social History  Substance Use Topics  . Smoking status: Current Every Day Smoker -- 1.00 packs/day    Types: Cigarettes  . Smokeless tobacco: None  . Alcohol Use: No    Family History Family History  Problem Relation Age of Onset  . Kidney cancer Neg Hx   . Prostate cancer Neg Hx   No family history of bleeding clotting disorders, porphyria or autoimmune disease  No Known Allergies   REVIEW OF SYSTEMS (Negative unless checked)  Constitutional: '[]'$ Weight loss  '[]'$ Fever  '[]'$ Chills Cardiac: '[]'$ Chest pain   '[]'$ Chest pressure   '[]'$ Palpitations   '[]'$ Shortness of breath when laying flat   '[]'$ Shortness of breath at rest   '[]'$ Shortness of breath with exertion. Vascular:  '[]'$ Pain in legs with walking   '[]'$ Pain  in legs at rest   '[]'$ Pain in legs when laying flat   '[]'$ Claudication   '[]'$ Pain in feet when walking  '[]'$ Pain in feet at rest  '[]'$ Pain in feet when laying flat   '[]'$ History of DVT   '[]'$ Phlebitis   '[]'$ Swelling in legs   '[]'$ Varicose veins   '[]'$ Non-healing ulcers Pulmonary:   '[]'$ Uses home oxygen   '[]'$ Productive cough   '[]'$ Hemoptysis   '[]'$ Wheeze  '[]'$ COPD   '[]'$ Asthma Neurologic:  '[]'$ Dizziness  '[]'$ Blackouts   '[]'$ Seizures   '[]'$ History of stroke   '[]'$ History of TIA  '[]'$ Aphasia   '[]'$ Temporary blindness   '[]'$ Dysphagia   '[]'$ Weakness or numbness in arms   '[]'$ Weakness or numbness in legs Musculoskeletal:  '[]'$ Arthritis   '[]'$ Joint swelling   '[]'$ Joint pain   '[]'$ Low back pain Hematologic:  '[]'$ Easy bruising  '[]'$ Easy bleeding   '[]'$ Hypercoagulable state   '[]'$ Anemic  '[]'$ Hepatitis Gastrointestinal:  '[]'$ Blood in stool   '[]'$ Vomiting blood  '[]'$ Gastroesophageal reflux/heartburn   '[]'$ Difficulty swallowing. Genitourinary:  '[]'$ Chronic kidney disease   '[]'$ Difficult urination  '[]'$ Frequent urination  '[]'$ Burning with urination   '[]'$ Blood in urine Skin:  '[]'$ Rashes   '[]'$ Ulcers   '[]'$ Wounds Psychological:  '[]'$ History of anxiety   '[]'$  History of major depression.  Physical Examination  Filed Vitals:   08/21/15 1048  BP: 119/73  Pulse: 66  Temp: 97.9 F (36.6 C)  Resp: 18  Height:  $'5\' 11"'H$  (1.803 m)  Weight: 78.472 kg (173 lb)  SpO2: 98%   Body mass index is 24.14 kg/(m^2). Gen: WD/WN, NAD Head: [place]/AT, No temporalis wasting. Prominent temp pulse not noted. Ear/Nose/Throat: Hearing grossly intact, nares w/o erythema or drainage, oropharynx w/o Erythema/Exudate,  Eyes: PERRLA, EOMI.  Neck: Supple, no nuchal rigidity.  No bruit or JVD.  Pulmonary:  Good air movement, clear to auscultation bilaterally, no use of accessory muscles.  Cardiac: RRR, normal S1, S2, no Murmurs, rubs or gallops. Vascular: Left arm fistula large aneurysm with skin erosions and deterioration. It is tender to palpation. There is a good thrill and good bruit noted. Vessel Right Left  Radial Palpable  Palpable  Ulnar Palpable Palpable  Brachial Palpable Palpable  Gastrointestinal: soft, non-tender/non-distended. No guarding/reflex.  Musculoskeletal: M/S 5/5 throughout.  Extremities without ischemic changes.  No deformity or atrophy.  Neurologic: CN 2-12 intact. Pain and light touch intact in extremities.  Symmetrical.  Speech is fluent. Motor exam as listed above. Psychiatric: Judgment intact, Mood & affect appropriate for pt's clinical situation. Dermatologic: No rashes or ulcers noted.  No cellulitis or open wounds. Lymph : No Cervical, Axillary, or Inguinal lymphadenopathy.     CBC Lab Results  Component Value Date   WBC 2.7* 08/16/2015   HGB 12.1* 08/16/2015   HCT 34.9* 08/16/2015   MCV 100.1* 08/16/2015   PLT 55* 08/16/2015    BMET    Component Value Date/Time   NA 137 08/16/2015 0443   K 3.4* 08/16/2015 0443   CL 100* 08/16/2015 0443   CO2 26 08/16/2015 0443   GLUCOSE 88 08/16/2015 0443   BUN 36* 08/16/2015 0443   CREATININE 5.67* 08/16/2015 0443   CALCIUM 8.1* 08/16/2015 0443   GFRNONAA 10* 08/16/2015 0443   GFRAA 12* 08/16/2015 0443   Estimated Creatinine Clearance: 15.1 mL/min (by C-G formula based on Cr of 5.67).  COAG No results found for: INR, PROTIME  Radiology Ct Abdomen Pelvis Wo Contrast  08/15/2015  CLINICAL DATA:  Lower abdominal pain with nausea, vomiting and diarrhea for 2 days, history COPD, CHF, MI, asthma, atrial fibrillation, smoker, liver disease EXAM: CT ABDOMEN AND PELVIS WITHOUT CONTRAST TECHNIQUE: Multidetector CT imaging of the abdomen and pelvis was performed following the standard protocol without IV contrast. Sagittal and coronal MPR images reconstructed from axial data set. Patient drank dilute oral contrast for exam. COMPARISON:  None FINDINGS: Lower chest:  Dependent bibasilar atelectasis. Hepatobiliary: Calcified gallstones within gallbladder. Nodular cirrhotic appearing liver. No focal hepatic mass or definite biliary dilatation.  Pancreas: Normal appearance Spleen: Enlarged, 17.6 cm length with calculated volume 1156 mL. No focal abnormalities. Adrenals/Urinary Tract: Adrenal glands normal appearance. BILATERAL atrophic kidneys. No gross evidence of renal mass, hydronephrosis, hydroureter, or urinary tract calcification. Unremarkable bladder. Minimal prostatic enlargement. Stomach/Bowel: Normal appendix. Scattered stool throughout colon. Stomach decompressed with suboptimal assessment of wall thickness. Small bowel loops unremarkable. No bowel dilatation, bowel wall thickening or evidence of obstruction. Small hiatal hernia with questionable wall thickening of distal esophagus, unable to exclude mass. 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 Vascular/Lymphatic: Scattered atherosclerotic calcification aorta. No adenopathy. Reproductive: N/A Other: No free air or free fluid. No hernia. Diffuse infiltrative change/edema involving sigmoid mesocolon, small bowel mesentery, pelvic soft tissue planes, and celiac axis. This is nonspecific but could reflect a combination of portal hypertension and hypoproteinemia as a result of cirrhosis. No discrete abscess or source of an acute inflammatory process is identified. Musculoskeletal: No acute osseous findings. 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 Cannot exclude esophageal wall thickening near a visualize small hiatal hernia; endoscopic follow-up recommended to exclude tumor. Scattered edema within sigmoid mesocolon, pelvic soft tissue planes, small bowel mesenteries celiac axis question reflecting portal hypertension and potentially hypoproteinemia. Cholelithiasis. BILATERAL renal cortical atrophy. Electronically Signed   By: Lavonia Dana M.D.   On: 08/15/2015 17:50    Assessment/Plan 1.  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 The patient will undergo fistulogram in preparation for revision and exclusion of the aneurysmal segments. The status of the arterial anastomosis as well as the venous outflow must be defined prior to subjecting the patient to surgery. 2.  End-stage renal disease requiring hemodialysis:  Patient will continue dialysis therapy without further interruption  3.  Hypertension:  Patient will continue medical management; nephrology is following no changes in oral medications. 4. COPD:  Patient's aerosols will be continued he will receive supplemental oxygen during the procedure he'll be monitored with end tidal CO2 as well as O2 saturations. 5.  Coronary artery disease:  EKG will be monitored. Nitrates will be used if needed. The patient's oral cardiac medications will be continued. 6.  Pancytopenia: I will contact Dr. Candiss Norse patient will likely need hematology evaluation prior to surgical intervention and revision. I will proceed with the angiogram today.     Schnier, Dolores Lory, MD  08/21/2015 12:21 PM

## 2015-08-21 NOTE — Op Note (Signed)
OPERATIVE NOTE   PROCEDURE: 1. Contrast injection left arm brachiocephalic fistula  PRE-OPERATIVE DIAGNOSIS: Complication of dialysis access                                                       End Stage Renal Disease  POST-OPERATIVE DIAGNOSIS: same as above   SURGEON: Katha Cabal, M.D.  ANESTHESIA: Conscious sedation was administered under my direct supervision. IV Versed plus fentanyl were utilized. Continuous ECG, pulse oximetry and blood pressure was monitored throughout the entire procedure. Versed and fentanyl were utilized.  Conscious sedation was for a total of 35 minutes.  ESTIMATED BLOOD LOSS: minimal  FINDING(S): 1. Arterial anastomosis is widely patent. 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 Visualized portions of the axillary and subclavian vein are widely patent as are the innominate and superior vena cava  SPECIMEN(S):  None  CONTRAST: 20 cc  FLUOROSCOPY TIME: 3 minutes  INDICATIONS: Mrk Buzby is a 60 y.o. male who  presents with malfunctioning left arm AV access.  The patient is scheduled for angiography with possible intervention of the AV access.  The patient is aware the risks include but are not limited to: bleeding, infection, thrombosis of the cannulated access, and possible anaphylactic reaction to the contrast.  The patient acknowledges if the access can not be salvaged a tunneled catheter will be needed and will be placed during this procedure.  The patient is aware of the risks of the procedure and elects to proceed with the angiogram and intervention.  DESCRIPTION: After full informed written consent was obtained, the patient was brought back to the Special Procedure suite and placed supine position.  Appropriate cardiopulmonary monitors were placed.  The left arm was prepped and draped in the standard fashion.  Appropriate timeout is called. The brachiocephalic  fistula  was cannulated with a micropuncture needle.  The microwire was advanced and the needle was exchanged for  a microsheath.  The J-wire was then advanced and a 6 Fr sheath inserted.  Hand injections were completed to image the access from the arterial anastomosis through the entire access.  The central venous structures were also imaged by hand injections.  Based on the images,  the arterial anastomosis and visualized portions of the brachial artery widely patent. 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 Central veins are all widely patent.    A 4-0 Monocryl purse-string suture was sewn around the sheath.  The sheath was removed and light pressure was applied.  A sterile bandage was applied to the puncture site.  SUMMARY: The arterial portion represents a good inflow for revision of the fistula the aneurysmal midportion of the fistula where there is ulceration of the skin will need to be resected. Rather then sew the revised prosthetic to the more proximal cephalic vein I would opt to create a brachial axillary graft using the arterial inflow from this fistula. This is because of the stricture at the cephalic subclavian confluence. Central veins are widely patent.    COMPLICATIONS: None  CONDITION: Carlynn Purl, M.D [facility] Office: [phone]  08/21/2015  1:25 PM

## 2015-09-03 ENCOUNTER — Inpatient Hospital Stay: Payer: Medicaid Other | Attending: Oncology | Admitting: Oncology

## 2015-09-03 ENCOUNTER — Inpatient Hospital Stay: Payer: Self-pay | Admitting: Internal Medicine

## 2015-09-03 ENCOUNTER — Ambulatory Visit: Payer: Medicaid Other

## 2015-09-03 VITALS — BP 130/72 | HR 73 | Temp 97.0°F | Resp 18 | Wt 179.6 lb

## 2015-09-03 DIAGNOSIS — D696 Thrombocytopenia, unspecified: Secondary | ICD-10-CM | POA: Insufficient documentation

## 2015-09-03 DIAGNOSIS — K7460 Unspecified cirrhosis of liver: Secondary | ICD-10-CM | POA: Diagnosis not present

## 2015-09-03 DIAGNOSIS — Z95810 Presence of automatic (implantable) cardiac defibrillator: Secondary | ICD-10-CM | POA: Diagnosis not present

## 2015-09-03 DIAGNOSIS — B1920 Unspecified viral hepatitis C without hepatic coma: Secondary | ICD-10-CM | POA: Diagnosis not present

## 2015-09-03 DIAGNOSIS — R161 Splenomegaly, not elsewhere classified: Secondary | ICD-10-CM | POA: Diagnosis not present

## 2015-09-03 DIAGNOSIS — F419 Anxiety disorder, unspecified: Secondary | ICD-10-CM | POA: Diagnosis not present

## 2015-09-03 DIAGNOSIS — E876 Hypokalemia: Secondary | ICD-10-CM | POA: Insufficient documentation

## 2015-09-03 DIAGNOSIS — M1990 Unspecified osteoarthritis, unspecified site: Secondary | ICD-10-CM | POA: Diagnosis not present

## 2015-09-03 DIAGNOSIS — I252 Old myocardial infarction: Secondary | ICD-10-CM | POA: Diagnosis not present

## 2015-09-03 DIAGNOSIS — J449 Chronic obstructive pulmonary disease, unspecified: Secondary | ICD-10-CM | POA: Diagnosis not present

## 2015-09-03 DIAGNOSIS — N186 End stage renal disease: Secondary | ICD-10-CM | POA: Diagnosis not present

## 2015-09-03 DIAGNOSIS — Z7982 Long term (current) use of aspirin: Secondary | ICD-10-CM | POA: Insufficient documentation

## 2015-09-03 DIAGNOSIS — F329 Major depressive disorder, single episode, unspecified: Secondary | ICD-10-CM | POA: Insufficient documentation

## 2015-09-03 DIAGNOSIS — K219 Gastro-esophageal reflux disease without esophagitis: Secondary | ICD-10-CM | POA: Insufficient documentation

## 2015-09-03 DIAGNOSIS — R911 Solitary pulmonary nodule: Secondary | ICD-10-CM | POA: Diagnosis not present

## 2015-09-03 DIAGNOSIS — M109 Gout, unspecified: Secondary | ICD-10-CM | POA: Insufficient documentation

## 2015-09-03 DIAGNOSIS — Z992 Dependence on renal dialysis: Secondary | ICD-10-CM | POA: Insufficient documentation

## 2015-09-03 DIAGNOSIS — Z79899 Other long term (current) drug therapy: Secondary | ICD-10-CM | POA: Insufficient documentation

## 2015-09-03 DIAGNOSIS — F17210 Nicotine dependence, cigarettes, uncomplicated: Secondary | ICD-10-CM | POA: Insufficient documentation

## 2015-09-03 DIAGNOSIS — D61818 Other pancytopenia: Secondary | ICD-10-CM

## 2015-09-03 DIAGNOSIS — I482 Chronic atrial fibrillation: Secondary | ICD-10-CM | POA: Insufficient documentation

## 2015-09-03 LAB — CBC
HCT: 33.9 % — ABNORMAL LOW (ref 40.0–52.0)
Hemoglobin: 11.7 g/dL — ABNORMAL LOW (ref 13.0–18.0)
MCH: 34.2 pg — AB (ref 26.0–34.0)
MCHC: 34.6 g/dL (ref 32.0–36.0)
MCV: 98.6 fL (ref 80.0–100.0)
PLATELETS: 55 10*3/uL — AB (ref 150–440)
RBC: 3.43 MIL/uL — AB (ref 4.40–5.90)
RDW: 15 % — AB (ref 11.5–14.5)
WBC: 2 10*3/uL — ABNORMAL LOW (ref 3.8–10.6)

## 2015-09-03 LAB — FOLATE: FOLATE: 17.7 ng/mL (ref >5.9)

## 2015-09-03 LAB — LACTATE DEHYDROGENASE: LDH: 156 U/L (ref 98–192)

## 2015-09-03 LAB — VITAMIN B12: Vitamin B-12: 1039 pg/mL — ABNORMAL HIGH (ref 180–914)

## 2015-09-03 LAB — IRON AND TIBC
Iron: 81 ug/dL (ref 45–182)
SATURATION RATIOS: 26 % (ref 17.9–39.5)
TIBC: 306 ug/dL (ref 250–450)
UIBC: 226 ug/dL

## 2015-09-03 LAB — FERRITIN: Ferritin: 236 ng/mL (ref 24–336)

## 2015-09-03 NOTE — Progress Notes (Signed)
Complaints of occasional stomach pains.

## 2015-09-04 ENCOUNTER — Inpatient Hospital Stay: Payer: Self-pay | Admitting: Internal Medicine

## 2015-09-04 LAB — PLATELET ANTIBODY PROFILE, SERUM
HLA AB SER QL EIA: NEGATIVE
IA/IIA ANTIBODY: NEGATIVE
IB/IX Antibody: NEGATIVE
IIB/IIIA Antibody: POSITIVE — AB

## 2015-09-04 LAB — HAPTOGLOBIN: Haptoglobin: 57 mg/dL (ref 34–200)

## 2015-09-07 LAB — PROTEIN ELECTROPHORESIS, SERUM
A/G Ratio: 1.1 (ref 0.7–1.7)
Albumin ELP: 3.8 g/dL (ref 2.9–4.4)
Alpha-1-Globulin: 0.3 g/dL (ref 0.0–0.4)
Alpha-2-Globulin: 0.6 g/dL (ref 0.4–1.0)
Beta Globulin: 0.9 g/dL (ref 0.7–1.3)
GAMMA GLOBULIN: 1.7 g/dL (ref 0.4–1.8)
GLOBULIN, TOTAL: 3.4 g/dL (ref 2.2–3.9)
TOTAL PROTEIN ELP: 7.2 g/dL (ref 6.0–8.5)

## 2015-09-08 ENCOUNTER — Telehealth: Payer: Self-pay

## 2015-09-08 NOTE — Telephone Encounter (Signed)
New patient referral From Dr. Duard Brady .  Patient has recently moved to area and needs Pacer check appt.  Dr. Caryl Comes has an opening on 8/29  Is this ok to add him in there?

## 2015-09-09 LAB — NEUTROPHIL AB TEST LEVEL 1: NEUTROPHIL SCR/PANEL INTERP.: NEGATIVE

## 2015-09-10 ENCOUNTER — Emergency Department: Payer: Medicaid Other

## 2015-09-10 ENCOUNTER — Observation Stay
Admission: EM | Admit: 2015-09-10 | Discharge: 2015-09-10 | Disposition: A | Discharge status: Home / Self Care | Payer: Medicaid Other | Attending: Internal Medicine | Admitting: Internal Medicine

## 2015-09-10 ENCOUNTER — Observation Stay (HOSPITAL_BASED_OUTPATIENT_CLINIC_OR_DEPARTMENT_OTHER)
Admit: 2015-09-10 | Discharge: 2015-09-10 | Disposition: A | Discharge status: Home / Self Care | Payer: Medicaid Other | Attending: Internal Medicine | Admitting: Internal Medicine

## 2015-09-10 ENCOUNTER — Observation Stay: Payer: Medicaid Other

## 2015-09-10 DIAGNOSIS — Z7951 Long term (current) use of inhaled steroids: Secondary | ICD-10-CM | POA: Diagnosis not present

## 2015-09-10 DIAGNOSIS — K7460 Unspecified cirrhosis of liver: Secondary | ICD-10-CM | POA: Diagnosis not present

## 2015-09-10 DIAGNOSIS — D638 Anemia in other chronic diseases classified elsewhere: Secondary | ICD-10-CM | POA: Diagnosis present

## 2015-09-10 DIAGNOSIS — M1990 Unspecified osteoarthritis, unspecified site: Secondary | ICD-10-CM | POA: Diagnosis not present

## 2015-09-10 DIAGNOSIS — D696 Thrombocytopenia, unspecified: Secondary | ICD-10-CM | POA: Diagnosis present

## 2015-09-10 DIAGNOSIS — R911 Solitary pulmonary nodule: Secondary | ICD-10-CM | POA: Insufficient documentation

## 2015-09-10 DIAGNOSIS — D631 Anemia in chronic kidney disease: Secondary | ICD-10-CM | POA: Insufficient documentation

## 2015-09-10 DIAGNOSIS — I132 Hypertensive heart and chronic kidney disease with heart failure and with stage 5 chronic kidney disease, or end stage renal disease: Secondary | ICD-10-CM | POA: Diagnosis not present

## 2015-09-10 DIAGNOSIS — Z7982 Long term (current) use of aspirin: Secondary | ICD-10-CM | POA: Insufficient documentation

## 2015-09-10 DIAGNOSIS — N2581 Secondary hyperparathyroidism of renal origin: Secondary | ICD-10-CM | POA: Insufficient documentation

## 2015-09-10 DIAGNOSIS — I509 Heart failure, unspecified: Secondary | ICD-10-CM | POA: Diagnosis not present

## 2015-09-10 DIAGNOSIS — B1920 Unspecified viral hepatitis C without hepatic coma: Secondary | ICD-10-CM | POA: Diagnosis not present

## 2015-09-10 DIAGNOSIS — K449 Diaphragmatic hernia without obstruction or gangrene: Secondary | ICD-10-CM | POA: Insufficient documentation

## 2015-09-10 DIAGNOSIS — N186 End stage renal disease: Secondary | ICD-10-CM | POA: Diagnosis not present

## 2015-09-10 DIAGNOSIS — G8929 Other chronic pain: Secondary | ICD-10-CM | POA: Insufficient documentation

## 2015-09-10 DIAGNOSIS — I8500 Esophageal varices without bleeding: Secondary | ICD-10-CM | POA: Insufficient documentation

## 2015-09-10 DIAGNOSIS — R079 Chest pain, unspecified: Secondary | ICD-10-CM | POA: Diagnosis not present

## 2015-09-10 DIAGNOSIS — B1910 Unspecified viral hepatitis B without hepatic coma: Secondary | ICD-10-CM | POA: Diagnosis not present

## 2015-09-10 DIAGNOSIS — Z992 Dependence on renal dialysis: Secondary | ICD-10-CM

## 2015-09-10 DIAGNOSIS — Z95818 Presence of other cardiac implants and grafts: Secondary | ICD-10-CM | POA: Diagnosis present

## 2015-09-10 DIAGNOSIS — Z79899 Other long term (current) drug therapy: Secondary | ICD-10-CM | POA: Insufficient documentation

## 2015-09-10 DIAGNOSIS — S299XXA Unspecified injury of thorax, initial encounter: Secondary | ICD-10-CM | POA: Diagnosis not present

## 2015-09-10 DIAGNOSIS — J449 Chronic obstructive pulmonary disease, unspecified: Secondary | ICD-10-CM | POA: Diagnosis present

## 2015-09-10 DIAGNOSIS — E876 Hypokalemia: Secondary | ICD-10-CM | POA: Diagnosis present

## 2015-09-10 DIAGNOSIS — Z8719 Personal history of other diseases of the digestive system: Secondary | ICD-10-CM | POA: Insufficient documentation

## 2015-09-10 DIAGNOSIS — Z950 Presence of cardiac pacemaker: Secondary | ICD-10-CM | POA: Insufficient documentation

## 2015-09-10 DIAGNOSIS — F329 Major depressive disorder, single episode, unspecified: Secondary | ICD-10-CM | POA: Diagnosis not present

## 2015-09-10 DIAGNOSIS — M109 Gout, unspecified: Secondary | ICD-10-CM | POA: Diagnosis not present

## 2015-09-10 DIAGNOSIS — R0789 Other chest pain: Secondary | ICD-10-CM | POA: Diagnosis not present

## 2015-09-10 DIAGNOSIS — W010XXA Fall on same level from slipping, tripping and stumbling without subsequent striking against object, initial encounter: Secondary | ICD-10-CM | POA: Diagnosis not present

## 2015-09-10 DIAGNOSIS — K219 Gastro-esophageal reflux disease without esophagitis: Secondary | ICD-10-CM | POA: Insufficient documentation

## 2015-09-10 DIAGNOSIS — F17210 Nicotine dependence, cigarettes, uncomplicated: Secondary | ICD-10-CM | POA: Diagnosis not present

## 2015-09-10 DIAGNOSIS — Z95810 Presence of automatic (implantable) cardiac defibrillator: Secondary | ICD-10-CM | POA: Insufficient documentation

## 2015-09-10 DIAGNOSIS — J432 Centrilobular emphysema: Secondary | ICD-10-CM

## 2015-09-10 DIAGNOSIS — F419 Anxiety disorder, unspecified: Secondary | ICD-10-CM | POA: Diagnosis not present

## 2015-09-10 DIAGNOSIS — I252 Old myocardial infarction: Secondary | ICD-10-CM | POA: Insufficient documentation

## 2015-09-10 DIAGNOSIS — R161 Splenomegaly, not elsewhere classified: Secondary | ICD-10-CM

## 2015-09-10 DIAGNOSIS — I482 Chronic atrial fibrillation, unspecified: Secondary | ICD-10-CM | POA: Diagnosis present

## 2015-09-10 DIAGNOSIS — K8020 Calculus of gallbladder without cholecystitis without obstruction: Secondary | ICD-10-CM | POA: Insufficient documentation

## 2015-09-10 HISTORY — DX: Anemia in other chronic diseases classified elsewhere: D63.8

## 2015-09-10 HISTORY — DX: Chronic atrial fibrillation, unspecified: I48.20

## 2015-09-10 HISTORY — DX: Unspecified cirrhosis of liver: K74.60

## 2015-09-10 HISTORY — DX: End stage renal disease: N18.6

## 2015-09-10 HISTORY — DX: Thrombocytopenia, unspecified: D69.6

## 2015-09-10 HISTORY — DX: Imprisonment and other incarceration: Z65.1

## 2015-09-10 HISTORY — DX: Dependence on renal dialysis: Z99.2

## 2015-09-10 HISTORY — DX: Unspecified viral hepatitis C without hepatic coma: B19.20

## 2015-09-10 LAB — BASIC METABOLIC PANEL
ANION GAP: 15 (ref 5–15)
BUN: 47 mg/dL — ABNORMAL HIGH (ref 6–20)
CALCIUM: 8.6 mg/dL — AB (ref 8.9–10.3)
CO2: 18 mmol/L — ABNORMAL LOW (ref 22–32)
Chloride: 106 mmol/L (ref 101–111)
Creatinine, Ser: 7.68 mg/dL — ABNORMAL HIGH (ref 0.61–1.24)
GFR, EST AFRICAN AMERICAN: 8 mL/min — AB (ref >60)
GFR, EST NON AFRICAN AMERICAN: 7 mL/min — AB (ref >60)
GLUCOSE: 117 mg/dL — AB (ref 65–99)
POTASSIUM: 2.5 mmol/L — AB (ref 3.5–5.1)
SODIUM: 139 mmol/L (ref 135–145)

## 2015-09-10 LAB — TROPONIN I

## 2015-09-10 LAB — ECHOCARDIOGRAM COMPLETE
HEIGHTINCHES: 71 in
Weight: 2812.8 oz

## 2015-09-10 LAB — CBC
HEMATOCRIT: 36.7 % — AB (ref 40.0–52.0)
HEMOGLOBIN: 12.8 g/dL — AB (ref 13.0–18.0)
MCH: 34.4 pg — ABNORMAL HIGH (ref 26.0–34.0)
MCHC: 34.8 g/dL (ref 32.0–36.0)
MCV: 99 fL (ref 80.0–100.0)
Platelets: 47 10*3/uL — ABNORMAL LOW (ref 150–440)
RBC: 3.7 MIL/uL — ABNORMAL LOW (ref 4.40–5.90)
RDW: 15.1 % — ABNORMAL HIGH (ref 11.5–14.5)
WBC: 3.4 10*3/uL — AB (ref 3.8–10.6)

## 2015-09-10 LAB — POTASSIUM: Potassium: 3.1 mmol/L — ABNORMAL LOW (ref 3.5–5.1)

## 2015-09-10 LAB — ETHANOL: ALCOHOL ETHYL (B): 19 mg/dL — AB (ref <5)

## 2015-09-10 LAB — PHOSPHORUS: Phosphorus: 6.6 mg/dL — ABNORMAL HIGH (ref 2.5–4.6)

## 2015-09-10 LAB — MAGNESIUM: Magnesium: 2.2 mg/dL (ref 1.7–2.4)

## 2015-09-10 LAB — HEMOGLOBIN A1C: HEMOGLOBIN A1C: 5.4 % (ref 4.0–6.0)

## 2015-09-10 MED ORDER — RANITIDINE HCL 300 MG PO TABS: 300.0000 mg | ORAL_TABLET | Freq: Two times a day (BID) | ORAL | Status: DC

## 2015-09-10 MED ORDER — METOPROLOL TARTRATE 25 MG PO TABS: 25.0000 mg | ORAL_TABLET | Freq: Two times a day (BID) | ORAL | Status: DC

## 2015-09-10 MED ORDER — TRIAMCINOLONE ACETONIDE 55 MCG/ACT NA AERO: 1.0000 | INHALATION_SPRAY | Freq: Every day | NASAL | Status: DC

## 2015-09-10 MED ORDER — QUETIAPINE FUMARATE 100 MG PO TABS: 100.0000 mg | ORAL_TABLET | Freq: Two times a day (BID) | ORAL | Status: DC

## 2015-09-10 MED ORDER — NICOTINE 14 MG/24HR TD PT24
14.0000 mg | MEDICATED_PATCH | Freq: Once | TRANSDERMAL | Status: DC
  Administered 2015-09-10: 14 mg via TRANSDERMAL
  Filled 2015-09-10: qty 1

## 2015-09-10 MED ORDER — OXYCODONE HCL 5 MG PO TABS
10.0000 mg | ORAL_TABLET | Freq: Four times a day (QID) | ORAL | Status: DC | PRN
  Administered 2015-09-10 (×2): 10 mg via ORAL
  Filled 2015-09-10 (×2): qty 2

## 2015-09-10 MED ORDER — TRAMADOL HCL 50 MG PO TABS: 50.0000 mg | ORAL_TABLET | Freq: Two times a day (BID) | ORAL | Status: DC | PRN

## 2015-09-10 MED ORDER — NITROGLYCERIN 0.4 MG SL SUBL
0.4000 mg | SUBLINGUAL_TABLET | SUBLINGUAL | Status: DC | PRN
Start: 2015-09-10 — End: 2015-09-11

## 2015-09-10 MED ORDER — MORPHINE SULFATE (PF) 2 MG/ML IV SOLN
2.0000 mg | Freq: Once | INTRAVENOUS | Status: AC
  Administered 2015-09-10: 2 mg via INTRAVENOUS
  Filled 2015-09-10: qty 1

## 2015-09-10 MED ORDER — VITAMIN B-12 1000 MCG PO TABS: 1000.0000 ug | ORAL_TABLET | Freq: Every day | ORAL | Status: DC

## 2015-09-10 MED ORDER — POTASSIUM CHLORIDE 10 MEQ/100ML IV SOLN
10.0000 meq | INTRAVENOUS | Status: DC
  Filled 2015-09-10 (×3): qty 100

## 2015-09-10 MED ORDER — PRAMOXINE HCL 1 % RE FOAM
1.0000 "application " | Freq: Three times a day (TID) | RECTAL | Status: DC | PRN
  Filled 2015-09-10: qty 15

## 2015-09-10 MED ORDER — TRAMADOL HCL 50 MG PO TABS: 50.0000 mg | ORAL_TABLET | Freq: Four times a day (QID) | ORAL | Status: DC | PRN

## 2015-09-10 MED ORDER — FLUTICASONE PROPIONATE 50 MCG/ACT NA SUSP
1.0000 | Freq: Every day | NASAL | Status: DC
  Administered 2015-09-10: 1 via NASAL
  Filled 2015-09-10: qty 16

## 2015-09-10 MED ORDER — ASPIRIN EC 81 MG PO TBEC: 81.0000 mg | DELAYED_RELEASE_TABLET | Freq: Every day | ORAL | Status: DC

## 2015-09-10 MED ORDER — DOCUSATE SODIUM 100 MG PO CAPS: 100.0000 mg | ORAL_CAPSULE | Freq: Two times a day (BID) | ORAL | Status: DC | PRN

## 2015-09-10 MED ORDER — ASPIRIN 81 MG PO CHEW: 324.0000 mg | CHEWABLE_TABLET | ORAL | Status: DC

## 2015-09-10 MED ORDER — ALBUTEROL SULFATE (2.5 MG/3ML) 0.083% IN NEBU: 2.5000 mg | INHALATION_SOLUTION | Freq: Four times a day (QID) | RESPIRATORY_TRACT | Status: DC | PRN

## 2015-09-10 MED ORDER — METOPROLOL TARTRATE 50 MG PO TABS: 50.0000 mg | ORAL_TABLET | Freq: Two times a day (BID) | ORAL | Status: DC

## 2015-09-10 MED ORDER — SODIUM CHLORIDE 0.9% FLUSH: 3.0000 mL | INTRAVENOUS | Status: DC | PRN

## 2015-09-10 MED ORDER — FUROSEMIDE 40 MG PO TABS
40.0000 mg | ORAL_TABLET | Freq: Two times a day (BID) | ORAL | Status: DC
  Administered 2015-09-10 (×2): 40 mg via ORAL
  Filled 2015-09-10 (×2): qty 1

## 2015-09-10 MED ORDER — ALBUTEROL SULFATE HFA 108 (90 BASE) MCG/ACT IN AERS
2.0000 | INHALATION_SPRAY | Freq: Four times a day (QID) | RESPIRATORY_TRACT | Status: DC | PRN
Start: 2015-09-10 — End: 2015-09-10

## 2015-09-10 MED ORDER — POTASSIUM CHLORIDE CRYS ER 20 MEQ PO TBCR
EXTENDED_RELEASE_TABLET | ORAL | Status: AC
  Administered 2015-09-10: 40 meq via ORAL
  Filled 2015-09-10: qty 2

## 2015-09-10 MED ORDER — SEVELAMER CARBONATE 800 MG PO TABS
800.0000 mg | ORAL_TABLET | Freq: Three times a day (TID) | ORAL | Status: DC
  Administered 2015-09-10 (×3): 800 mg via ORAL
  Filled 2015-09-10 (×3): qty 1

## 2015-09-10 MED ORDER — OXYCODONE HCL 10 MG PO TABS: 10.0000 mg | ORAL_TABLET | Freq: Three times a day (TID) | ORAL | Status: DC | PRN

## 2015-09-10 MED ORDER — HYDROCORTISONE 2.5 % EX CREA
1.0000 "application " | TOPICAL_CREAM | Freq: Two times a day (BID) | CUTANEOUS | Status: DC
  Filled 2015-09-10: qty 20

## 2015-09-10 MED ORDER — ALBUTEROL SULFATE HFA 108 (90 BASE) MCG/ACT IN AERS: 2.0000 | INHALATION_SPRAY | Freq: Four times a day (QID) | RESPIRATORY_TRACT | Status: AC | PRN

## 2015-09-10 MED ORDER — HYDROCORTISONE 2.5 % EX CREA: 1.0000 "application " | TOPICAL_CREAM | Freq: Two times a day (BID) | CUTANEOUS | Status: DC | PRN

## 2015-09-10 MED ORDER — PRAMOXINE HCL 1 % RE FOAM: 1.0000 "application " | Freq: Three times a day (TID) | RECTAL | Status: DC | PRN

## 2015-09-10 MED ORDER — FAMOTIDINE 20 MG PO TABS
20.0000 mg | ORAL_TABLET | Freq: Every day | ORAL | Status: DC
  Administered 2015-09-10: 20 mg via ORAL
  Filled 2015-09-10: qty 1

## 2015-09-10 MED ORDER — CALCIUM CARBONATE ANTACID 500 MG PO CHEW: 2.0000 | CHEWABLE_TABLET | Freq: Four times a day (QID) | ORAL | Status: DC | PRN

## 2015-09-10 MED ORDER — ASPIRIN 300 MG RE SUPP: 300.0000 mg | RECTAL | Status: DC

## 2015-09-10 MED ORDER — OXYCODONE HCL 5 MG PO TABS
10.0000 mg | ORAL_TABLET | Freq: Once | ORAL | Status: AC
  Administered 2015-09-10: 10 mg via ORAL
  Filled 2015-09-10: qty 2

## 2015-09-10 MED ORDER — FUROSEMIDE 40 MG PO TABS
40.0000 mg | ORAL_TABLET | Freq: Two times a day (BID) | ORAL | Status: DC
Start: 2015-09-10 — End: 2015-12-09

## 2015-09-10 MED ORDER — SODIUM CHLORIDE 0.9 % IV SOLN
250.0000 mL | INTRAVENOUS | Status: DC | PRN
Start: 2015-09-10 — End: 2015-09-11

## 2015-09-10 MED ORDER — SEVELAMER CARBONATE 800 MG PO TABS
800.0000 mg | ORAL_TABLET | Freq: Three times a day (TID) | ORAL | Status: AC
Start: 2015-09-10 — End: ?

## 2015-09-10 MED ORDER — SODIUM CHLORIDE 0.9% FLUSH
3.0000 mL | Freq: Two times a day (BID) | INTRAVENOUS | Status: DC
  Administered 2015-09-10: 3 mL via INTRAVENOUS

## 2015-09-10 MED ORDER — FOLIC ACID 1 MG PO TABS
1.0000 mg | ORAL_TABLET | Freq: Every day | ORAL | Status: DC
  Administered 2015-09-10: 1 mg via ORAL
  Filled 2015-09-10: qty 1

## 2015-09-10 MED ORDER — BISACODYL 10 MG RE SUPP: 10.0000 mg | Freq: Every day | RECTAL | Status: DC | PRN

## 2015-09-10 MED ORDER — ACETAMINOPHEN 325 MG PO TABS: 650.0000 mg | ORAL_TABLET | ORAL | Status: DC | PRN

## 2015-09-10 MED ORDER — ONDANSETRON HCL 4 MG/2ML IJ SOLN: 4.0000 mg | Freq: Four times a day (QID) | INTRAMUSCULAR | Status: DC | PRN

## 2015-09-10 MED ORDER — POTASSIUM CHLORIDE CRYS ER 20 MEQ PO TBCR
40.0000 meq | EXTENDED_RELEASE_TABLET | Freq: Once | ORAL | Status: AC
  Administered 2015-09-10: 40 meq via ORAL

## 2015-09-10 MED ORDER — DOCUSATE SODIUM 100 MG PO CAPS
100.0000 mg | ORAL_CAPSULE | Freq: Two times a day (BID) | ORAL | Status: DC
  Administered 2015-09-10: 100 mg via ORAL
  Filled 2015-09-10: qty 1

## 2015-09-10 MED ORDER — FOLIC ACID 1 MG PO TABS
1.0000 mg | ORAL_TABLET | Freq: Every day | ORAL | Status: DC
Start: 2015-09-10 — End: 2015-09-14

## 2015-09-10 MED ORDER — ASPIRIN EC 81 MG PO TBEC
81.0000 mg | DELAYED_RELEASE_TABLET | Freq: Every day | ORAL | Status: DC
  Administered 2015-09-10: 81 mg via ORAL
  Filled 2015-09-10: qty 1

## 2015-09-10 NOTE — ED Notes (Signed)
Pt given graham crackers and Coca [employer]a to drink.

## 2015-09-10 NOTE — Progress Notes (Signed)
Tx ended. Pt insisted to get off machine ealry, so he can catch ride for discharge. Explained the benefits to staying on machine. Pt voiced understanding and still wanted to get off.  MD notified. Pt stable.

## 2015-09-10 NOTE — ED Provider Notes (Signed)
[facility] Emergency Department Provider Note  ____________________________________________  Time seen: 3:00 AM  I have reviewed the triage vital signs and the nursing notes.   HISTORY  Chief Complaint Chest Pain      HPI Richard Herring is a 60 y.o. male with history of atrial fibrillation and congestive heart failure MI chronic kidney disease dialysis Tuesday Thursday Saturday presents via EMS from [facility] with a history of for 5 days of central chest pain with radiation to the left arm. Patient also admits to dyspnea. While in route with EMS patient was offered aspirin which she refused in addition patient refuses an IV placement and was doing likewise when he arrives to the emergency department Patient states that he was seen recently in the emergency department with recommendation for admission however he did not want to stay and a such left AMA.    Past Medical History  Diagnosis Date  . Renal disorder   . COPD (chronic obstructive pulmonary disease) ([place])   . A-fib ([place])   . Anxiety   . GERD (gastroesophageal reflux disease)   . Arthritis   . Asthma   . Depression   . Gout   . Heart attack ([place])   . Congestive heart failure ([place])   . Heart disease   . Kidney failure   . Liver disease   . Stomach ulcer     Patient Active Problem List   Diagnosis Date Noted  . Infection of AV graft for dialysis ([place]) 08/15/2015  . History of Fournier's gangrene 08/11/2015  . Acute gastroenteritis 07/01/2015    Past Surgical History  Procedure Laterality Date  . Peritoneal wound drainage  2015,2016    gangreen    . Cardiac defibrillator placement  2015  . Dialysis fistula creation  2014  . Peripheral vascular catheterization Left 08/21/2015    Procedure: A/V Shuntogram/Fistulagram;  Surgeon: Katha Cabal, MD;  Location: [facility];  Service: Cardiovascular;  Laterality: Left;  . Peripheral vascular  catheterization N/A 08/21/2015    Procedure: A/V Shunt Intervention;  Surgeon: Katha Cabal, MD;  Location: [facility];  Service: Cardiovascular;  Laterality: N/A;    Current Outpatient Rx  Name  Route  Sig  Dispense  Refill  . albuterol (PROVENTIL HFA;VENTOLIN HFA) 108 (90 Base) MCG/ACT inhaler   Inhalation   Inhale 2 puffs into the lungs 4 (four) times daily as needed for wheezing or shortness of breath.         Marland Kitchen aspirin EC 81 MG tablet   Oral   Take 81 mg by mouth daily. Reported on 08/06/2015         . bisacodyl (DULCOLAX) 10 MG suppository   Rectal   Place 10 mg rectally daily as needed for moderate constipation.         . calcium carbonate (TUMS - DOSED IN MG ELEMENTAL CALCIUM) 500 MG chewable tablet   Oral   Chew 2 tablets by mouth 4 (four) times daily as needed for indigestion or heartburn.         . docusate sodium (COLACE) 100 MG capsule   Oral   Take 100 mg by mouth 2 (two) times daily.         . folic acid (FOLVITE) 1 MG tablet   Oral   Take 1 mg by mouth daily.         . furosemide (LASIX) 40 MG tablet   Oral   Take  40 mg by mouth 2 (two) times daily.          . hydrocortisone 2.5 % cream   Topical   Apply 1 application topically 2 (two) times daily as needed.         . metoprolol (LOPRESSOR) 50 MG tablet   Oral   Take 50 mg by mouth 3 (three) times daily. Reported on 08/21/2015         . pramoxine (PROCTOFOAM) 1 % foam   Rectal   Place 1 application rectally 3 (three) times daily as needed for itching.         Marland Kitchen QUEtiapine (SEROQUEL) 100 MG tablet   Oral   Take 100 mg by mouth 2 (two) times daily.         . ranitidine (ZANTAC) 300 MG tablet   Oral   Take 300 mg by mouth 2 (two) times daily.         . sevelamer carbonate (RENVELA) 800 MG tablet   Oral   Take 800 mg by mouth 3 (three) times daily with meals.         . traMADol (ULTRAM) 50 MG tablet   Oral   Take 50 mg by mouth every 6 (six) hours as needed.           . triamcinolone (NASACORT ALLERGY 24HR) 55 MCG/ACT AERO nasal inhaler   Nasal   Place 1 spray into the nose daily.         . vitamin B-12 (CYANOCOBALAMIN) 1000 MCG tablet   Oral   Take 1,000 mcg by mouth daily.           Allergies No known drug allergies  Family History  Problem Relation Age of Onset  . Kidney cancer Neg Hx   . Prostate cancer Neg Hx     Social History Social History  Substance Use Topics  . Smoking status: Current Every Day Smoker -- 1.00 packs/day    Types: Cigarettes  . Smokeless tobacco: None  . Alcohol Use: No    Review of Systems  Constitutional: Negative for fever. Eyes: Negative for visual changes. ENT: Negative for sore throat. Cardiovascular: Positive for chest pain. Respiratory: Negative for shortness of breath. Gastrointestinal: Negative for abdominal pain, vomiting and diarrhea. Genitourinary: Negative for dysuria. Musculoskeletal: Negative for back pain. Skin: Negative for rash. Neurological: Negative for headaches, focal weakness or numbness.   10-point ROS otherwise negative.  ____________________________________________   PHYSICAL EXAM:  VITAL SIGNS: ED Triage Vitals  Enc Vitals Group     BP 09/10/15 0304 130/70 mmHg     Pulse Rate 09/10/15 0304 114     Resp 09/10/15 0304 19     Temp 09/10/15 0304 98.5 F (36.9 C)     Temp Source 09/10/15 0304 Oral     SpO2 09/10/15 0304 99 %     Weight 09/10/15 0304 178 lb (80.74 kg)     Height 09/10/15 0304 '5\' 11"'$  (1.803 m)     Head Cir --      Peak Flow --      Pain Score 09/10/15 0304 10     Pain Loc --      Pain Edu? --      Excl. in [place]? --     Constitutional: Alert and oriented. Well appearing and in no distress. Eyes: Conjunctivae are normal. PERRL. Normal extraocular movements. ENT   Head: Normocephalic and atraumatic.   Nose: No congestion/rhinnorhea.   Mouth/Throat: Mucous membranes are moist.  Neck: No  stridor. Hematological/Lymphatic/Immunilogical: No cervical lymphadenopathy. Cardiovascular: Irregular regular rhythm. Normal and symmetric distal pulses are present in all extremities. No murmurs, rubs, or gallops. Respiratory: Normal respiratory effort without tachypnea nor retractions. Breath sounds are clear and equal bilaterally. No wheezes/rales/rhonchi. Gastrointestinal: Soft and nontender. No distention. There is no CVA tenderness. Genitourinary: deferred Musculoskeletal: Nontender with normal range of motion in all extremities. No joint effusions.  No lower extremity tenderness nor edema. Neurologic:  Normal speech and language. No gross focal neurologic deficits are appreciated. Speech is normal.  Skin:  Skin is warm, dry and intact. No rash noted. Psychiatric: Mood and affect are normal. Speech and behavior are normal. Patient exhibits appropriate insight and judgment.  ____________________________________________    LABS (pertinent positives/negatives)  Labs Reviewed  BASIC METABOLIC PANEL - Abnormal; Notable for the following:    Potassium 2.5 (*)    CO2 18 (*)    Glucose, Bld 117 (*)    BUN 47 (*)    Creatinine, Ser 7.68 (*)    Calcium 8.6 (*)    GFR calc non Af Amer 7 (*)    GFR calc Af Amer 8 (*)    All other components within normal limits  CBC - Abnormal; Notable for the following:    WBC 3.4 (*)    RBC 3.70 (*)    Hemoglobin 12.8 (*)    HCT 36.7 (*)    MCH 34.4 (*)    RDW 15.1 (*)    Platelets 47 (*)    All other components within normal limits  ETHANOL - Abnormal; Notable for the following:    Alcohol, Ethyl (B) 19 (*)    All other components within normal limits  TROPONIN I     ____________________________________________   EKG  ED ECG REPORT I,  N Brenee Gajda, the attending physician, personally viewed and interpreted this ECG.   Date: 09/10/2015  EKG Time: 2:52 AM  Rate: 124  Rhythm: Sinus tachycardia,  Axis: None  Intervals: Normal   ST&T Change: Inferior ST segment depression   ____________________________________________    RADIOLOGY     DG Ribs Unilateral Left (Final result) Result time: 09/10/15 12:06:05   Final result by Rad Results In Interface (09/10/15 12:06:05)   Narrative:   CLINICAL DATA: Right-sided rib pain.  EXAM: LEFT RIBS - 2 VIEW  COMPARISON: Chest x-ray 09/10/2015 and chest CT 06/30/2015  FINDINGS: The cardiac silhouette, mediastinal and hilar contours are normal in stable. Stable emphysematous changes and pulmonary scarring. Stable left upper lobe pulmonary lesion. The loop recorder is noted on the left. No infiltrates, edema or effusions.  Dedicated views of the left ribs do not demonstrate any definite acute left-sided rib fractures.  IMPRESSION: No acute cardiopulmonary findings. Stable emphysematous changes and pulmonary scarring.  Stable left upper lobe pulmonary lesion.  No definite acute left-sided rib fractures or bone lesions.   Electronically Signed By: Marijo Sanes M.D. On: 09/10/2015 12:06          DG Chest Port 1 View (Final result) Result time: 09/10/15 03:15:47   Final result by Rad Results In Interface (09/10/15 03:15:47)   Narrative:   CLINICAL DATA: 4-5 days of LEFT chest pain, vomiting and weakness. History of COPD, heart attack.  EXAM: PORTABLE CHEST 1 VIEW  COMPARISON: CT chest Jun 30, 2015  FINDINGS: Re- demonstration of spiculated LEFT upper lobe nodule. Increased lung volumes compatible with COPD Common no pleural effusion or focal consolidation. No pneumothorax. Cardiomediastinal silhouette appears normal. Loop monitor projecting  LEFT chest. Remote RIGHT distal clavicle resection and shoulder separation.  IMPRESSION: Re- demonstration LEFT upper lobe lung nodule, previous recommendation for PET-CT stands.  COPD.   Electronically Signed By: Elon Alas M.D. On: 09/10/2015 03:15      ______________________ Procedures    INITIAL IMPRESSION / ASSESSMENT AND PLAN / ED COURSE  Pertinent labs & imaging results that were available during my care of the patient were reviewed by me and considered in my medical decision making (see chart for details).  Patient refused aspirin  ____________________________________________   FINAL CLINICAL IMPRESSION(S) / ED DIAGNOSES  Final diagnoses:  Left sided chest pain      Gregor Hams, MD 09/11/15 [phone]

## 2015-09-10 NOTE — Progress Notes (Signed)
Post HD assessment  

## 2015-09-10 NOTE — ED Notes (Signed)
Called RN informed bed ready [phone]

## 2015-09-10 NOTE — Discharge Instructions (Signed)
Cardiac renal diet  Activity as tolerated  QUIT SMOKING

## 2015-09-10 NOTE — NC FL2 (Signed)
[place] LEVEL OF CARE SCREENING TOOL     IDENTIFICATION  Patient Name: Richard Herring Birthdate: [birth year] Sex: male Admission Date (Current Location): 09/10/2015  [facility] [place] and [place] Number:  Selena Lesser  ([id number]) Facility and Address:  [facility], [address] [place],  [address]      Provider Number: [id number]  Attending Physician Name and Address:  Hillary Bow, MD  Relative Name and Phone Number:       Current Level of Care: Hospital Recommended Level of Care: [place] (B and [place]) Prior Approval Number:    Date Approved/Denied:   PASRR Number:    Discharge Plan: Other (Comment) ([place] and [place])    Current Diagnoses: Patient Active Problem List   Diagnosis Date Noted  . Musculoskeletal chest pain 09/10/2015  . Chronic atrial fibrillation ([place]) 09/10/2015  . ESRD (end stage renal disease) on dialysis ([place]) 09/10/2015  . Hypokalemia 09/10/2015  . Status post placement of implantable loop recorder 09/10/2015  . COPD (chronic obstructive pulmonary disease) ([place]) 09/10/2015  . Cirrhosis of liver ([place]) 09/10/2015  . Hepatitis C 09/10/2015  . Anemia of chronic disease 09/10/2015  . Thrombocytopenia ([place]) 09/10/2015  . Infection of AV graft for dialysis ([place]) 08/15/2015  . History of Fournier's gangrene 08/11/2015  . Acute gastroenteritis 07/01/2015    Orientation RESPIRATION BLADDER Height & Weight     Self, Time, Situation, Place  Normal Continent Weight: 175 lb 12.8 oz (79.742 kg) Height:  '5\' 11"'$  (180.3 cm)  BEHAVIORAL SYMPTOMS/MOOD NEUROLOGICAL BOWEL NUTRITION STATUS   (None)  (None) Continent Diet (renal with fluid restriction Fluid restriction:: 1200 mL Fluid)  AMBULATORY STATUS COMMUNICATION OF NEEDS Skin   Independent Verbally Normal                       Personal Care Assistance Level of Assistance  Bathing, Feeding, Dressing Bathing Assistance:  Independent Feeding assistance: Independent Dressing Assistance: Independent     Functional Limitations Info  Sight, Hearing, Speech Sight Info: Adequate Hearing Info: Adequate Speech Info: Adequate    SPECIAL CARE FACTORS FREQUENCY                       Contractures      Additional Factors Info  Code Status, Allergies, Isolation Precautions Code Status Info:  (Full Code) Allergies Info:  (No Known Allergies)     Isolation Precautions Info:  (Contact Precautions- MRSA)       Discharge Medications: albuterol 108 (90 Base) MCG/ACT inhaler  Commonly known as: PROVENTIL HFA;VENTOLIN HFA  Inhale 2 puffs into the lungs 4 (four) times daily as needed for wheezing or shortness of breath.    aspirin EC 81 MG tablet  Take 81 mg by mouth daily. Reported on 08/06/2015  Last time this was given: 81 mg on 09/10/2015  9:15 AM       calcium carbonate 500 MG chewable tablet  Commonly known as: TUMS - dosed in mg elemental calcium  Chew 2 tablets by mouth 4 (four) times daily as needed for indigestion or heartburn.       docusate sodium 100 MG capsule  Commonly known as: COLACE  Take 1 capsule (100 mg total) by mouth 2 (two) times daily as needed for mild constipation.  What changed:  - when to take this - reasons to take this  Last time this was given: 100 mg on 09/10/2015  9:13 AM  folic acid 1 MG tablet  Commonly known as: FOLVITE  Take 1 tablet (1 mg total) by mouth daily.  Last time this was given: 1 mg on 09/10/2015  9:15 AM       furosemide 40 MG tablet  Commonly known as: LASIX  Take 1 tablet (40 mg total) by mouth 2 (two) times daily.  Last time this was given: 40 mg on 09/10/2015 11:57 AM       hydrocortisone 2.5 % cream  Apply 1 application topically 2 (two) times daily as needed.       metoprolol 50 MG tablet  Commonly known as: LOPRESSOR  Take 1 tablet (50 mg total) by mouth 2 (two) times daily. Reported on 08/21/2015  What changed: when to take  this       Oxycodone HCl 10 MG Tabs  Take 1 tablet (10 mg total) by mouth every 8 (eight) hours as needed for severe pain.  Last time this was given: 10 mg on 09/10/2015  2:44 PM       pramoxine 1 % foam  Commonly known as: PROCTOFOAM  Place 1 application rectally 3 (three) times daily as needed for itching.       QUEtiapine 100 MG tablet  Commonly known as: SEROQUEL  Take 1 tablet (100 mg total) by mouth 2 (two) times daily.       ranitidine 300 MG tablet  Commonly known as: ZANTAC  Take 1 tablet (300 mg total) by mouth 2 (two) times daily.       sevelamer carbonate 800 MG tablet  Commonly known as: RENVELA  Take 1 tablet (800 mg total) by mouth 3 (three) times daily with meals.  Last time this was given: 800 mg on 09/10/2015 11:57 AM       triamcinolone 55 MCG/ACT Aero nasal inhaler  Commonly known as: NASACORT ALLERGY 24HR  Place 1 spray into the nose daily.       vitamin B-12 1000 MCG tablet  Commonly known as: CYANOCOBALAMIN  Take 1 tablet (1,000 mcg total) by mouth daily.                    Relevant Imaging Results:  Relevant Lab Results:   Additional Information  (SSN [id number])  Lorenso Quarry Zahid Carneiro, LCSW

## 2015-09-10 NOTE — Consult Note (Signed)
Cardiology Consultation Note  Patient ID: Richard Herring, MRN: [id number], DOB/AGE: 1956-01-01 60 y.o. Admit date: 09/10/2015   Date of Consult: 09/10/2015 Primary Physician: Murlean Iba, MD Primary Cardiologist: [place], [place] Requesting Physician: Dr. Estanislado Pandy, MD  Chief Complaint: Musculoskeletal chest s/p drunken fall with trauma to the chest Reason for Consult: Atypical chest pain  HPI: 60 y.o. male with h/o chronic Afib not on full dose anticoagulation per [place] cardiologist and presumed thrombocytopenia, possible tachy-brady syndrome s/p LOOP recorder, ESRD on HD x 12 years, polysubstance abuse, anemia of chronic disease, recently released from incarceration x 11 years, cirrhosis, hepatitis C, patient reported "cancer", COPD, and illiterate who presented to [facility] after suffering a drunken fall on the porch and hit his anterior chest wall on the railing leading to MSK chest pain.   He was previously followed by a [place] cardiologist, though he does not know who. He has not seen a cardiologist in years. He does not know any of his at home medications. He does not feel his Afib. He was previously a heavy abuser of alcohol, though reports being clean for the past 11 years while in jail. He was at a party the night before and got drunk. This led to a fall on the porch, hitting his anterior chest wall on the railing. He presented to [facility] with MSK chest pain. He has not been experiencing any recent symptoms concerning for angina.   Upon the patient's arrival to [facility] they were found to have potassium 2.5 with ordered repletion, negative troponin, ETOH elevated at 19, PLT 47, HGB 12.8. ECG as below, CXR showed left upper node pulmonary nodule with recommended PET-CT scan. He currently has multiple complaints, mainly about getting his "pain medication" for what he says is chronic pain.    Past Medical History  Diagnosis Date  . COPD (chronic obstructive pulmonary disease) ([place])   . Chronic atrial  fibrillation ([place])   . Anxiety   . GERD (gastroesophageal reflux disease)   . Arthritis   . Asthma   . Depression   . Gout   . ESRD on dialysis ([place])   . Cirrhosis ([place])   . Stomach ulcer   . Hepatitis C   . Incarceration       Most Recent Cardiac Studies: None available for review   Surgical History:  Past Surgical History  Procedure Laterality Date  . Peritoneal wound drainage  2015,2016    gangreen    . Cardiac defibrillator placement  2015  . Dialysis fistula creation  2014  . Peripheral vascular catheterization Left 08/21/2015    Procedure: A/V Shuntogram/Fistulagram;  Surgeon: Katha Cabal, MD;  Location: [place] [facility];  Service: Cardiovascular;  Laterality: Left;  . Peripheral vascular catheterization N/A 08/21/2015    Procedure: A/V Shunt Intervention;  Surgeon: Katha Cabal, MD;  Location: [place] [facility];  Service: Cardiovascular;  Laterality: N/A;     Home Meds: Prior to Admission medications   Medication Sig Start Date End Date Taking? Authorizing Provider  albuterol (PROVENTIL HFA;VENTOLIN HFA) 108 (90 Base) MCG/ACT inhaler Inhale 2 puffs into the lungs 4 (four) times daily as needed for wheezing or shortness of breath.   Yes Historical Provider, MD  aspirin EC 81 MG tablet Take 81 mg by mouth daily. Reported on 08/06/2015   Yes Historical Provider, MD  bisacodyl (DULCOLAX) 10 MG suppository Place 10 mg rectally daily as needed for moderate constipation.   Yes Historical Provider, MD  calcium carbonate (TUMS - DOSED  IN MG ELEMENTAL CALCIUM) 500 MG chewable tablet Chew 2 tablets by mouth 4 (four) times daily as needed for indigestion or heartburn.   Yes Historical Provider, MD  docusate sodium (COLACE) 100 MG capsule Take 100 mg by mouth 2 (two) times daily.   Yes Historical Provider, MD  folic acid (FOLVITE) 1 MG tablet Take 1 mg by mouth daily.   Yes Historical Provider, MD  furosemide (LASIX) 40 MG tablet Take 40 mg by mouth 2 (two) times  daily.    Yes Historical Provider, MD  hydrocortisone 2.5 % cream Apply 1 application topically 2 (two) times daily as needed.   Yes Historical Provider, MD  metoprolol (LOPRESSOR) 50 MG tablet Take 50 mg by mouth 3 (three) times daily. Reported on 08/21/2015   Yes Historical Provider, MD  pramoxine (PROCTOFOAM) 1 % foam Place 1 application rectally 3 (three) times daily as needed for itching.   Yes Historical Provider, MD  QUEtiapine (SEROQUEL) 100 MG tablet Take 100 mg by mouth 2 (two) times daily.   Yes Historical Provider, MD  ranitidine (ZANTAC) 300 MG tablet Take 300 mg by mouth 2 (two) times daily.   Yes Historical Provider, MD  sevelamer carbonate (RENVELA) 800 MG tablet Take 800 mg by mouth 3 (three) times daily with meals.   Yes Historical Provider, MD  traMADol (ULTRAM) 50 MG tablet Take 50 mg by mouth every 6 (six) hours as needed.    Yes Historical Provider, MD  triamcinolone (NASACORT ALLERGY 24HR) 55 MCG/ACT AERO nasal inhaler Place 1 spray into the nose daily.   Yes Historical Provider, MD  vitamin B-12 (CYANOCOBALAMIN) 1000 MCG tablet Take 1,000 mcg by mouth daily.   Yes Historical Provider, MD    Inpatient Medications:  . aspirin EC  81 mg Oral Daily  . docusate sodium  100 mg Oral BID  . famotidine  20 mg Oral Daily  . fluticasone  1 spray Each Nare Daily  . folic acid  1 mg Oral Daily  . furosemide  40 mg Oral BID  . hydrocortisone  1 application Topical BID  . metoprolol tartrate  25 mg Oral BID  . QUEtiapine  100 mg Oral BID  . sevelamer carbonate  800 mg Oral TID WC  . sodium chloride flush  3 mL Intravenous Q12H  . vitamin B-12  1,000 mcg Oral Daily      Allergies: No Known Allergies  Social History   Social History  . Marital Status: Single    Spouse Name: N/A  . Number of Children: N/A  . Years of Education: N/A   Occupational History  . disabled    Social History Main Topics  . Smoking status: Current Every Day Smoker -- 1.00 packs/day    Types:  Cigarettes  . Smokeless tobacco: Not on file  . Alcohol Use: 0.0 oz/week    0 Standard drinks or equivalent per week     Comment: drinks beer  . Drug Use: No  . Sexual Activity: Not Currently   Other Topics Concern  . Not on file   Social History Narrative     Family History  Problem Relation Age of Onset  . Kidney cancer Neg Hx   . Prostate cancer Neg Hx      Review of Systems: Review of Systems  Constitutional: Positive for malaise/fatigue. Negative for fever, chills, weight loss and diaphoresis.  HENT: Negative for congestion.   Eyes: Negative for discharge and redness.  Respiratory: Negative for cough, hemoptysis, sputum  production, shortness of breath and wheezing.   Cardiovascular: Positive for chest pain. Negative for palpitations, orthopnea, claudication, leg swelling and PND.  Gastrointestinal: Negative for heartburn, nausea, vomiting and abdominal pain.  Musculoskeletal: Negative for myalgias and falls.  Skin: Negative for rash.  Neurological: Negative for dizziness, tingling, tremors, sensory change, speech change, focal weakness, loss of consciousness and weakness.  Endo/Heme/Allergies: Does not bruise/bleed easily.  Psychiatric/Behavioral: Positive for substance abuse. The patient is not nervous/anxious.   All other systems reviewed and are negative.   Labs:  Recent Labs  09/10/15 0252  TROPONINI <0.03   Lab Results  Component Value Date   WBC 3.4* 09/10/2015   HGB 12.8* 09/10/2015   HCT 36.7* 09/10/2015   MCV 99.0 09/10/2015   PLT 47* 09/10/2015     Recent Labs Lab 09/10/15 0252  NA 139  K 2.5*  CL 106  CO2 18*  BUN 47*  CREATININE 7.68*  CALCIUM 8.6*  GLUCOSE 117*   No results found for: CHOL, HDL, LDLCALC, TRIG No results found for: DDIMER  Radiology/Studies:  Ct Abdomen Pelvis Wo Contrast  08/15/2015  CLINICAL DATA:  Lower abdominal pain with nausea, vomiting and diarrhea for 2 days, history COPD, CHF, MI, asthma, atrial  fibrillation, smoker, liver disease EXAM: CT ABDOMEN AND PELVIS WITHOUT CONTRAST TECHNIQUE: Multidetector CT imaging of the abdomen and pelvis was performed following the standard protocol without IV contrast. Sagittal and coronal MPR images reconstructed from axial data set. Patient drank dilute oral contrast for exam. COMPARISON:  None FINDINGS: Lower chest:  Dependent bibasilar atelectasis. Hepatobiliary: Calcified gallstones within gallbladder. Nodular cirrhotic appearing liver. No focal hepatic mass or definite biliary dilatation. Pancreas: Normal appearance Spleen: Enlarged, 17.6 cm length with calculated volume 1156 mL. No focal abnormalities. Adrenals/Urinary Tract: Adrenal glands normal appearance. BILATERAL atrophic kidneys. No gross evidence of renal mass, hydronephrosis, hydroureter, or urinary tract calcification. Unremarkable bladder. Minimal prostatic enlargement. Stomach/Bowel: Normal appendix. Scattered stool throughout colon. Stomach decompressed with suboptimal assessment of wall thickness. Small bowel loops unremarkable. No bowel dilatation, bowel wall thickening or evidence of obstruction. Small hiatal hernia with questionable wall thickening of distal esophagus, unable to exclude mass. 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 Vascular/Lymphatic: Scattered atherosclerotic calcification aorta. No adenopathy. Reproductive: N/A Other: No free air or free fluid. No hernia. Diffuse infiltrative change/edema involving sigmoid mesocolon, small bowel mesentery, pelvic soft tissue planes, and celiac axis. This is nonspecific but could reflect a combination of portal hypertension and hypoproteinemia as a result of cirrhosis. No discrete abscess or source of an acute inflammatory process is identified. Musculoskeletal: No acute osseous findings. 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 Cannot exclude esophageal wall thickening near a visualize small hiatal hernia;  endoscopic follow-up recommended to exclude tumor. Scattered edema within sigmoid mesocolon, pelvic soft tissue planes, small bowel mesenteries celiac axis question reflecting portal hypertension and potentially hypoproteinemia. Cholelithiasis. BILATERAL renal cortical atrophy. Electronically Signed   By: Lavonia Dana M.D.   On: 08/15/2015 17:50   Dg Chest Port 1 View  09/10/2015  CLINICAL DATA:  4-5 days of LEFT chest pain, vomiting and weakness. History of COPD, heart attack. EXAM: PORTABLE CHEST 1 VIEW COMPARISON:  CT chest Jun 30, 2015 FINDINGS: Re- demonstration of spiculated LEFT upper lobe nodule. Increased lung volumes compatible with COPD Common no pleural effusion or focal consolidation. No pneumothorax. Cardiomediastinal silhouette appears normal. Loop monitor projecting LEFT chest. Remote RIGHT distal clavicle resection and shoulder separation. IMPRESSION: Re- demonstration LEFT upper lobe lung nodule, previous  recommendation for PET-CT stands. COPD. Electronically Signed   By: Elon Alas M.D.   On: 09/10/2015 03:15    EKG: Interpreted by me showed: Afib with RVR, 120's bpm, nonspecific st/t changes  Telemetry: Interpreted by me showed: not hooked up currently  Weights: Filed Weights   09/10/15 0304 09/10/15 0755  Weight: 178 lb (80.74 kg) 175 lb 12.8 oz (79.742 kg)     Physical Exam: Blood pressure 114/67, pulse 77, temperature 96.9 F (36.1 C), temperature source Axillary, resp. rate 18, height '5\' 11"'$  (1.803 m), weight 175 lb 12.8 oz (79.742 kg), SpO2 100 %. Body mass index is 24.53 kg/(m^2). General: Well developed, well nourished, in no acute distress. Head: Normocephalic, atraumatic, sclera non-icteric, no xanthomas, nares are without discharge.  Neck: Negative for carotid bruits. JVD not elevated. Lungs: Clear bilaterally to auscultation without wheezes, rales, or rhonchi. Breathing is unlabored. Heart: Irregularly-irregular, with S1 S2. No murmurs, rubs, or gallops  appreciated. Chest pain is 100% reproducible to palpation on exam.  Abdomen: Soft, non-tender, non-distended with normoactive bowel sounds. No hepatomegaly. No rebound/guarding. No obvious abdominal masses. Msk:  Strength and tone appear normal for age. Extremities: No clubbing or cyanosis. No edema. Distal pedal pulses are 2+ and equal bilaterally. Neuro: Alert and oriented X 3. No facial asymmetry. No focal deficit. Moves all extremities spontaneously. Psych:  Responds to questions appropriately with a normal affect.    Assessment and Plan:  Principal Problem:   Musculoskeletal chest pain Active Problems:   Hypokalemia   Cirrhosis of liver (H[facility])   Hepatitis C   Thrombocytopenia ([facility])   Chronic atrial fibrillation ([facility])   ESRD (end stage renal disease) on dialysis ([facility])   COPD (chronic obstructive pulmonary disease) ([facility])   Anemia of chronic disease   Status post placement of implantable loop recorder    1. Atypical chest pain: -His pain is 100% reproducible to palpation on exam making this MSK in etiology and not cardiac -Troponin negative to date -Has not been with any symptoms concerning for angina -Check echo to evaluate LV systolic function for baseline -No indication for inpatient ischemia evaluation, consider outpatient  -A1C and lipid pending for risk stratification  -Non cardiac chest pain per IM  2. Chronic Afib: -Status post LOOP recorder for presumed tachy-brady syndrome, though there are no records for review and the patient does not know what cardiologist he has seen or when the LOOP was even implanted -Not on long term, full dose anticoagulation for presumed thrombocytopenia -Current rate controlled, continue  -Defer anticoagulation at this time given PLT count of 47 -Will need EP follow up for LOOP recorder -CHADS2VASc 0 based on available data  -Check A1C -Echo pending as above  3. ESRD on HD: -Renal on board -Volume managed through dialysis  4.  Hypokalemia: -Repletion has been ordered -Per Renal given he is HD patient   5. Polysubstance abuse: -Ongoing tobacco abuse for "all my life" -He was found to have an elevated ETOH upon admission, and has a long history of alcohol abuse, though reports being sober x 11 years as he was incarcerated  -Cessation advised -Check urine drug screen   6. Reported cirrhosis/hepatitis C: -Per IM  7. Illiterate: -Will need assistance with medications at discharge  -Recommend consulting care manager, defer to IM  8. COPD: -Per IM  9. Thrombocytopenia: -Hematology has bene consulted -Precludes full-dose anticoagulation at this time  10. Anemia of chronic disease: -Stable -Per IM   Signed, Christell Faith, PA-C [facility]  Pager: [phone] 09/10/2015, 8:44 AM

## 2015-09-10 NOTE — Progress Notes (Signed)
PRE HD assessment 

## 2015-09-10 NOTE — Progress Notes (Signed)
Hd TX started

## 2015-09-10 NOTE — H&P (Addendum)
[facility] [place] NAME: Richard Herring    MR#:  [id number]  DATE OF BIRTH:  01/23/1956  DATE OF ADMISSION:  09/10/2015  PRIMARY CARE PHYSICIAN: Murlean Iba, MD   REQUESTING/REFERRING PHYSICIAN:   CHIEF COMPLAINT:   Chief Complaint  Patient presents with  . Chest Pain    HISTORY OF PRESENT ILLNESS: Kobe Ofallon  is a 60 y.o. male with a known history of Chronic obstructive pulmonary disease, end-stage renal disease on dialysis, chronic atrial fibrillation, GERD, arthritis, gout, congestive heart failure presented to the emergency room with chest pain. Patient was trying to fix a ceiling fan in the porch and he slipped and fell down and hit the left side of the chest wall. Patient has a defibrillator placed in the left-sided chest. Patient has soreness in the left chest wall and has chest pain. The pain is sharp in nature 7 out of 10 on a scale of 1-10. Patient is on dialysis and gets dialyzed on Tuesday and Thursday and Saturday. Has occasional cough but no fever or chills. No history of shortness of breath. No history of orthopnea or proximal nocturnal dyspnea. Potassium was low in the emergency room and it was supplemented orally. EKG showed a ST segment depression. First set of troponin was negative. Hospitalist service was consulted for further care of the patient. No loss of consciousness or head injury. No history of any seizure. Tenderness on palpation of left chest wall.  PAST MEDICAL HISTORY:   Past Medical History  Diagnosis Date  . Renal disorder   . COPD (chronic obstructive pulmonary disease) ([place])   . A-fib ([place])   . Anxiety   . GERD (gastroesophageal reflux disease)   . Arthritis   . Asthma   . Depression   . Gout   . Heart attack ([place])   . Congestive heart failure ([place])   . Heart disease   . Kidney failure   . Liver disease   . Stomach ulcer   . ESRD on dialysis [facility])     PAST SURGICAL HISTORY: Past Surgical  History  Procedure Laterality Date  . Peritoneal wound drainage  2015,2016    gangreen    . Cardiac defibrillator placement  2015  . Dialysis fistula creation  2014  . Peripheral vascular catheterization Left 08/21/2015    Procedure: A/V Shuntogram/Fistulagram;  Surgeon: Katha Cabal, MD;  Location: [facility];  Service: Cardiovascular;  Laterality: Left;  . Peripheral vascular catheterization N/A 08/21/2015    Procedure: A/V Shunt Intervention;  Surgeon: Katha Cabal, MD;  Location: [place] [facility];  Service: Cardiovascular;  Laterality: N/A;    SOCIAL HISTORY:  Social History  Substance Use Topics  . Smoking status: Current Every Day Smoker -- 1.00 packs/day    Types: Cigarettes  . Smokeless tobacco: Not on file  . Alcohol Use: 0.0 oz/week    0 Standard drinks or equivalent per week     Comment: drinks beer    FAMILY HISTORY:  Family History  Problem Relation Age of Onset  . Kidney cancer Neg Hx   . Prostate cancer Neg Hx     DRUG ALLERGIES: No Known Allergies  REVIEW OF SYSTEMS:   CONSTITUTIONAL: No fever, fatigue or weakness.  EYES: No blurred or double vision.  EARS, NOSE, AND THROAT: No tinnitus or ear pain.  RESPIRATORY: Has occasional cough, no shortness of breath, wheezing or hemoptysis.  CARDIOVASCULAR: Has chest pain, no orthopnea, edema.  No palpitations. GASTROINTESTINAL: No nausea, vomiting, diarrhea or abdominal pain.  GENITOURINARY: No dysuria, hematuria.  ENDOCRINE: No polyuria, nocturia,  HEMATOLOGY: No anemia, easy bruising or bleeding SKIN: No rash or lesion. MUSCULOSKELETAL: No joint pain or arthritis.   NEUROLOGIC: No tingling, numbness, weakness.  PSYCHIATRY: No anxiety or depression.   MEDICATIONS AT HOME:  Prior to Admission medications   Medication Sig Start Date End Date Taking? Authorizing Provider  albuterol (PROVENTIL HFA;VENTOLIN HFA) 108 (90 Base) MCG/ACT inhaler Inhale 2 puffs into the lungs 4 (four) times daily  as needed for wheezing or shortness of breath.   Yes Historical Provider, MD  aspirin EC 81 MG tablet Take 81 mg by mouth daily. Reported on 08/06/2015   Yes Historical Provider, MD  bisacodyl (DULCOLAX) 10 MG suppository Place 10 mg rectally daily as needed for moderate constipation.   Yes Historical Provider, MD  calcium carbonate (TUMS - DOSED IN MG ELEMENTAL CALCIUM) 500 MG chewable tablet Chew 2 tablets by mouth 4 (four) times daily as needed for indigestion or heartburn.   Yes Historical Provider, MD  docusate sodium (COLACE) 100 MG capsule Take 100 mg by mouth 2 (two) times daily.   Yes Historical Provider, MD  folic acid (FOLVITE) 1 MG tablet Take 1 mg by mouth daily.   Yes Historical Provider, MD  furosemide (LASIX) 40 MG tablet Take 40 mg by mouth 2 (two) times daily.    Yes Historical Provider, MD  hydrocortisone 2.5 % cream Apply 1 application topically 2 (two) times daily as needed.   Yes Historical Provider, MD  metoprolol (LOPRESSOR) 50 MG tablet Take 50 mg by mouth 3 (three) times daily. Reported on 08/21/2015   Yes Historical Provider, MD  pramoxine (PROCTOFOAM) 1 % foam Place 1 application rectally 3 (three) times daily as needed for itching.   Yes Historical Provider, MD  QUEtiapine (SEROQUEL) 100 MG tablet Take 100 mg by mouth 2 (two) times daily.   Yes Historical Provider, MD  ranitidine (ZANTAC) 300 MG tablet Take 300 mg by mouth 2 (two) times daily.   Yes Historical Provider, MD  sevelamer carbonate (RENVELA) 800 MG tablet Take 800 mg by mouth 3 (three) times daily with meals.   Yes Historical Provider, MD  traMADol (ULTRAM) 50 MG tablet Take 50 mg by mouth every 6 (six) hours as needed.    Yes Historical Provider, MD  triamcinolone (NASACORT ALLERGY 24HR) 55 MCG/ACT AERO nasal inhaler Place 1 spray into the nose daily.   Yes Historical Provider, MD  vitamin B-12 (CYANOCOBALAMIN) 1000 MCG tablet Take 1,000 mcg by mouth daily.   Yes Historical Provider, MD      PHYSICAL  EXAMINATION:   VITAL SIGNS: Blood pressure 123/103, pulse 104, temperature 98.5 F (36.9 C), temperature source Oral, resp. rate 16, height '5\' 11"'$  (1.803 m), weight 80.74 kg (178 lb), SpO2 99 %.  GENERAL:  60 y.o.-year-old patient lying in the bed with no acute distress.  EYES: Pupils equal, round, reactive to light and accommodation. No scleral icterus. Extraocular muscles intact.  HEENT: Head atraumatic, normocephalic. Oropharynx and nasopharynx clear.  NECK:  Supple, no jugular venous distention. No thyroid enlargement, no tenderness.  LUNGS: Normal breath sounds bilaterally, no wheezing, rales,rhonchi or crepitation. No use of accessory muscles of respiration.  CARDIOVASCULAR: S1, S2 irregular. No murmurs, rubs, or gallops.  ABDOMEN: Soft, nontender, nondistended. Bowel sounds present. No organomegaly or mass.  EXTREMITIES: No pedal edema, cyanosis, or clubbing. Left arm AV fistula noted NEUROLOGIC: Cranial nerves II  through XII are intact. Muscle strength 5/5 in all extremities. Sensation intact. Gait not checked.  PSYCHIATRIC: The patient is alert and oriented x 3.  SKIN: No obvious rash, lesion, or ulcer.   LABORATORY PANEL:   CBC  Recent Labs Lab 09/03/15 1615 09/10/15 0252  WBC 2.0* 3.4*  HGB 11.7* 12.8*  HCT 33.9* 36.7*  PLT 55* 47*  MCV 98.6 99.0  MCH 34.2* 34.4*  MCHC 34.6 34.8  RDW 15.0* 15.1*   ------------------------------------------------------------------------------------------------------------------  Chemistries   Recent Labs Lab 09/10/15 0252  NA 139  K 2.5*  CL 106  CO2 18*  GLUCOSE 117*  BUN 47*  CREATININE 7.68*  CALCIUM 8.6*   ------------------------------------------------------------------------------------------------------------------ estimated creatinine clearance is 11.2 mL/min (by C-G formula based on Cr of 7.68). ------------------------------------------------------------------------------------------------------------------ No  results for input(s): TSH, T4TOTAL, T3FREE, THYROIDAB in the last 72 hours.  Invalid input(s): FREET3   Coagulation profile No results for input(s): INR, PROTIME in the last 168 hours. ------------------------------------------------------------------------------------------------------------------- No results for input(s): DDIMER in the last 72 hours. -------------------------------------------------------------------------------------------------------------------  Cardiac Enzymes  Recent Labs Lab 09/10/15 0252  TROPONINI <0.03   ------------------------------------------------------------------------------------------------------------------ Invalid input(s): POCBNP  ---------------------------------------------------------------------------------------------------------------  Urinalysis    Component Value Date/Time   COLORURINE STRAW* 08/15/2015 1546   APPEARANCEUR CLEAR* 08/15/2015 1546   LABSPEC 1.003* 08/15/2015 1546   PHURINE 9.0* 08/15/2015 1546   GLUCOSEU NEGATIVE 08/15/2015 1546   HGBUR 1+* 08/15/2015 1546   BILIRUBINUR NEGATIVE 08/15/2015 1546   KETONESUR NEGATIVE 08/15/2015 1546   PROTEINUR NEGATIVE 08/15/2015 1546   NITRITE NEGATIVE 08/15/2015 1546   LEUKOCYTESUR NEGATIVE 08/15/2015 1546     RADIOLOGY: Dg Chest Port 1 View  09/10/2015  CLINICAL DATA:  4-5 days of LEFT chest pain, vomiting and weakness. History of COPD, heart attack. EXAM: PORTABLE CHEST 1 VIEW COMPARISON:  CT chest Jun 30, 2015 FINDINGS: Re- demonstration of spiculated LEFT upper lobe nodule. Increased lung volumes compatible with COPD Common no pleural effusion or focal consolidation. No pneumothorax. Cardiomediastinal silhouette appears normal. Loop monitor projecting LEFT chest. Remote RIGHT distal clavicle resection and shoulder separation. IMPRESSION: Re- demonstration LEFT upper lobe lung nodule, previous recommendation for PET-CT stands. COPD. Electronically Signed   By: Elon Alas M.D.   On: 09/10/2015 03:15    EKG: Orders placed or performed during the hospital encounter of 09/10/15  . ED EKG  . ED EKG  . EKG 12-Lead  . EKG 12-Lead    IMPRESSION AND PLAN: 60 year old male patient with history of end-stage renal is on dialysis, COPD, hypertension, chronic atrial fibrillation, gout, congestive heart failure presented to the emergency room with chest pain. Admitting diagnosis 1. Chest pain most probably musculoskeletal etiology 2. Accidental fall 3. End-stage renal disease on dialysis 4. Chronic atrial fibrillation 5 severe hypokalemia 6.Gout 7.Thrombocytopenia  Treatment Plan : Admit patient to telemetry observation bed   aspirin 81 mg daily Cycle troponin to rule out ischemia and check echocardiogram Cardiology evaluation Follow-up platelet count Replace potassium intravenously Pain management.  All the records are reviewed and case discussed with ED provider. Management plans discussed with the patient, family and they are in agreement.  CODE STATUS:FULL Code Status History    Date Active Date Inactive Code Status Order ID Comments User Context   08/15/2015  5:54 PM 08/16/2015  4:05 PM Full Code [id number]  Bettey Costa, MD Inpatient   07/01/2015  5:01 PM 07/02/2015  9:56 PM Full Code [id number]  Nicholes Mango, MD Inpatient       TOTAL TIME TAKING  CARE OF THIS PATIENT: 50 minutes.    Saundra Shelling M.D on 09/10/2015 at 6:49 AM  Between 7am to 6pm - Pager - [phone]  After 6pm go to [email or web address] - password [email or web address]  Office  [phone]  CC: Primary care physician; Murlean Iba, MD

## 2015-09-10 NOTE — Progress Notes (Signed)
Pt placed on contact isolation per Hubert Azure. Pt tested positive last month for MRSA. Mrs Verl Blalock states no need to retest patient. Pt placed on contact precautions. I will continue to assess.

## 2015-09-10 NOTE — ED Notes (Addendum)
PT presents to ED via [facility] from [facility] assisted living. Pt states 4-5 days of chest pain, L chest. Pt is very agitated upon arrival, states he did not want to come to ED. "I'm allergic to hospitals." Pt states vomiting and weakness. Hx of afib, presenting in a fib, with EMS HR was going from 70's to 180's. Pt states he fell yesterday and "landed on L chest." which is area hurting him. Pt states he can't read or spell and informs staff that he wants the papers he came with to go home with him, along with EKG. Pt admits to drinking 2 beers tonight. Pt has medtronic pacemaker L chest. Pt admitted to [facility], RN that he "smoked an unrolled joint" and is unsure if it was laced with anything. Pt has S-shaped fistula on L arm, goes to dialysis Tues, Thurs, Sat, states that he has not missed any dialysis treatments. EMS VS 120/60, 100% on RA. Pt refused aspirin with EMS and IV, stated "it makes my blood too thin."

## 2015-09-10 NOTE — Progress Notes (Signed)
*  PRELIMINARY RESULTS* Echocardiogram 2D Echocardiogram has been performed.  Sherrie Sport 09/10/2015, 11:28 AM

## 2015-09-10 NOTE — Clinical Social Work Note (Signed)
Clinical Social Work Assessment  Patient Details  Name: Richard Herring MRN: [id number] Date of Birth: 1956/02/13  Date of referral:  09/10/15               Reason for consult:  Discharge Planning                Permission sought to share information with:  Facility Art therapist granted to share information::  Yes, Verbal Permission Granted  Name::        Agency::   ([facility] )  Relationship::     Contact Information:     Housing/Transportation Living arrangements for the past 2 months:  Group Home ([facility]) Source of Information:  Patient Patient Interpreter Needed:  None Criminal Activity/Legal Involvement Pertinent to Current Situation/Hospitalization:  No - Comment as needed Significant Relationships:  Other Family Members Lives with:  Facility Resident ([facility]) Do you feel safe going back to the place where you live?  Yes Need for family participation in patient care:  No (Coment)  Care giving concerns:  Patient is a resident at [facility]    Social Worker assessment / plan:  CSW met with patient at bedside. Patient was sitting in the chair. CSW introduced herself and her role. Patient reports that he lives at [facility] where he believes they are taking all of his money. He reports that he wants to move somewhere else and the social workers at his dialysis center are assisting him with it. He reports that he does not like living at [facility] but was placed there after he got out of prison. He reported he was in prison for 11 years. He stated that he's ready to go home. Reported that he is willing to return to [facility]. Granted CSW verbal to coordinate discharge with [facility].   FL2 completed and placed in MDs basket for cosign.  Spoke to [facility]. She reports she is willing to take patient back. Reported that [facility] can transport patient back to facility. Reported to tell the taxi company to charge [facility] account. CSW informed RN of above. Faxed FL2 and  discharge information to [facility].   Clinical Social Worker was informed that patient will be medically ready to discharge to [facility]. Patient in a agreement with plan. CSW called  [facility]  to confirm that patient's bed is ready.  All discharge information faxed to [facility] via [place]. RN will call rto arrange taxi transport when patient completes diyalsis.   Employment status:  Disabled (Comment on whether or not currently receiving Disability) Insurance information:  Medicaid [place] PT Recommendations:  Not assessed at this time Information / Referral to community resources:     Patient/Family's Response to care: Patient is in agreement to return to [facility]   Patient/Family's Understanding of and Emotional Response to Diagnosis, Current Treatment, and Prognosis:  Patient reports he understands his diagnosis, current treatment, and prognosis.   Emotional Assessment Appearance:  Appears stated age Attitude/Demeanor/Rapport:  Angry Affect (typically observed):  Accepting Orientation:  Oriented to Self, Oriented to Place, Oriented to  Time, Oriented to Situation Alcohol / Substance use:  Not Applicable Psych involvement (Current and /or in the community):  No (Comment)  Discharge Needs  Concerns to be addressed:  Discharge Planning Concerns Readmission within the last 30 days:  No Current discharge risk:  None Barriers to Discharge:  No Barriers Identified   [place], LCSW 09/10/2015, 2:36 PM

## 2015-09-10 NOTE — Progress Notes (Signed)
PRE HD   

## 2015-09-10 NOTE — Progress Notes (Signed)
Subjective:   Patient known to [place] from outpatient dialysis. He presents for several days of chest pain after a fall. Able to eat okay. No nausea or vomiting. No SOB or leg edema. Last dialysis was on Wednesday. Admission labs show low Potassium.   Objective:  Vital signs in last 24 hours:  Temp:  [96.9 F (36.1 C)-98.5 F (36.9 C)] 97.8 F (36.6 C) (07/20 1144) Pulse Rate:  [70-120] 82 (07/20 1144) Resp:  [14-19] 19 (07/20 1144) BP: (93-130)/(57-103) 112/67 mmHg (07/20 1144) SpO2:  [98 %-100 %] 100 % (07/20 1144) Weight:  [79.742 kg (175 lb 12.8 oz)-80.74 kg (178 lb)] 79.742 kg (175 lb 12.8 oz) (07/20 0755)  Weight change:  Filed Weights   09/10/15 0304 09/10/15 0755  Weight: 80.74 kg (178 lb) 79.742 kg (175 lb 12.8 oz)    Intake/Output:    Intake/Output Summary (Last 24 hours) at 09/10/15 1212 Last data filed at 09/10/15 1154  Gross per 24 hour  Intake    360 ml  Output      0 ml  Net    360 ml     Physical Exam: General: NAD, laying in bed   HEENT Moist oral mucus membranes.   Neck Supple  Pulm/lungs Clear to auscultation. No crackles.  CVS/Heart Irregular rhythm. Atrial Fibrillation. No rub.  Abdomen:  Soft, non tender, non distended.  Extremities: No edema.  Neurologic: Alert, oriented.  Skin: Healing ulcer over AV access.  Access: Left upper arm AV fistula.       Basic Metabolic Panel:   Recent Labs Lab 09/10/15 0252 09/10/15 0852  NA 139  --   K 2.5* 3.1*  CL 106  --   CO2 18*  --   GLUCOSE 117*  --   BUN 47*  --   CREATININE 7.68*  --   CALCIUM 8.6*  --      CBC:  Recent Labs Lab 09/03/15 1615 09/10/15 0252  WBC 2.0* 3.4*  HGB 11.7* 12.8*  HCT 33.9* 36.7*  MCV 98.6 99.0  PLT 55* 47*      Microbiology:  Recent Results (from the past 720 hour(s))  Culture, blood (Routine X 2) w Reflex to ID Panel     Status: None   Collection Time: 08/15/15  4:41 PM  Result Value Ref Range Status   Specimen Description BLOOD RIGHT  HAND  Final   Special Requests   Final    BOTTLES DRAWN AEROBIC AND ANAEROBIC  AEROBIC 8CC, ANAEROBIC 4CC   Culture NO GROWTH 5 DAYS  Final   Report Status 08/20/2015 FINAL  Final  Culture, blood (Routine X 2) w Reflex to ID Panel     Status: None   Collection Time: 08/15/15  4:42 PM  Result Value Ref Range Status   Specimen Description BLOOD RIGHT ASSIST CONTROL  Final   Special Requests BOTTLES DRAWN AEROBIC AND ANAEROBIC  4CC  Final   Culture NO GROWTH 5 DAYS  Final   Report Status 08/20/2015 FINAL  Final  MRSA PCR Screening     Status: Abnormal   Collection Time: 08/15/15  7:07 PM  Result Value Ref Range Status   MRSA by PCR POSITIVE (A) NEGATIVE Final    Comment:        The GeneXpert MRSA Assay (FDA approved for NASAL specimens only), is one component of a comprehensive MRSA colonization surveillance program. It is not intended to diagnose MRSA infection nor to guide or monitor treatment for MRSA infections. CRITICAL RESULT  CALLED TO, READ BACK BY AND VERIFIED WITH: DAWN SONGSTER AT 2058 08/15/15.PMH     Coagulation Studies: No results for input(s): LABPROT, INR in the last 72 hours.  Urinalysis: No results for input(s): COLORURINE, LABSPEC, PHURINE, GLUCOSEU, HGBUR, BILIRUBINUR, KETONESUR, PROTEINUR, UROBILINOGEN, NITRITE, LEUKOCYTESUR in the last 72 hours.  Invalid input(s): APPERANCEUR    Imaging: Dg Ribs Unilateral Left  09/10/2015  CLINICAL DATA:  Right-sided rib pain. EXAM: LEFT RIBS - 2 VIEW COMPARISON:  Chest x-ray 09/10/2015 and chest CT 06/30/2015 FINDINGS: The cardiac silhouette, mediastinal and hilar contours are normal in stable. Stable emphysematous changes and pulmonary scarring. Stable left upper lobe pulmonary lesion. The loop recorder is noted on the left. No infiltrates, edema or effusions. Dedicated views of the left ribs do not demonstrate any definite acute left-sided rib fractures. IMPRESSION: No acute cardiopulmonary findings. Stable emphysematous  changes and pulmonary scarring. Stable left upper lobe pulmonary lesion. No definite acute left-sided rib fractures or bone lesions. Electronically Signed   By: Marijo Sanes M.D.   On: 09/10/2015 12:06   Dg Chest Port 1 View  09/10/2015  CLINICAL DATA:  4-5 days of LEFT chest pain, vomiting and weakness. History of COPD, heart attack. EXAM: PORTABLE CHEST 1 VIEW COMPARISON:  CT chest Jun 30, 2015 FINDINGS: Re- demonstration of spiculated LEFT upper lobe nodule. Increased lung volumes compatible with COPD Common no pleural effusion or focal consolidation. No pneumothorax. Cardiomediastinal silhouette appears normal. Loop monitor projecting LEFT chest. Remote RIGHT distal clavicle resection and shoulder separation. IMPRESSION: Re- demonstration LEFT upper lobe lung nodule, previous recommendation for PET-CT stands. COPD. Electronically Signed   By: Elon Alas M.D.   On: 09/10/2015 03:15     Medications:     . aspirin EC  81 mg Oral Daily  . docusate sodium  100 mg Oral BID  . famotidine  20 mg Oral Daily  . fluticasone  1 spray Each Nare Daily  . folic acid  1 mg Oral Daily  . furosemide  40 mg Oral BID  . hydrocortisone  1 application Topical BID  . metoprolol tartrate  25 mg Oral BID  . nicotine  14 mg Transdermal Once  . QUEtiapine  100 mg Oral BID  . sevelamer carbonate  800 mg Oral TID WC  . sodium chloride flush  3 mL Intravenous Q12H  . vitamin B-12  1,000 mcg Oral Daily   sodium chloride, acetaminophen, albuterol, bisacodyl, calcium carbonate, nitroGLYCERIN, ondansetron (ZOFRAN) IV, oxyCODONE, pramoxine, sodium chloride flush, traMADol  Assessment/ Plan:  60 y.o. male with End Stage Renal Disease on hemodialysis via left arm AVF since 2007, atrial fibrillation with pacemaker, COPD with ongoing tobacco use, history of depression, history of fourniers gangrene.   TTS [facility] [employer].   1. End Stage Renal Disease - Schedule dialysis today. - Minimal ultra-filtration  goal.  2. Secondary Hyperparathyroidism: - Continue sevelamer for binding.   3. Anemia of chronic kidney disease: hemoglobin at goal.  - Will hold Epo until lung mass has been evaluated.  4. Lung Mass: spiculated lesion on left upper lobe from CT from 5/9.  - Hematology has been consulted and will re-evaluate lung mass in August.   LOS:  Murlean Iba 7/20/201712:12 PM

## 2015-09-10 NOTE — Progress Notes (Signed)
Patient is from [facility]. CSW is following.  Ernest Pine, MSW, [place], [place] Clinical Social Worker [phone]

## 2015-09-10 NOTE — Progress Notes (Signed)
[facility]  [phone] [phone]  ID: Richard Herring OB: [birth year]  MR#: [id number]  WGY#:[id number]  Patient Care Team: Murlean Iba, MD as PCP - General (Internal Medicine)  CHIEF COMPLAINT: Left upper lobe pulmonary nodule, pancytopenia, thrombocytopenia.  INTERVAL HISTORY: Patient is a 60 year old male recently out of prison and on dialysis who has been referral for evaluation of a pulmonary nodule as well as persistent thrombocytopenia and pancytopenia.  He complains of intermittent abdominal pain, but otherwise feels well and is asymptomatic. He has no neurologic complaints.  He denies any recent fevers or illnesses.  He denies any easy bleeding or bruising. He has a good appetite and denies weight loss. He has no chest pain, cough, shortness of breath, or hemoptysis. He denies any nausea, vomiting, constipation, or diarrhea. He has no melena or hematochezia. He has no urinary complaints. Patient offers no further specific complaints today.    REVIEW OF SYSTEMS:   Review of Systems  Constitutional: Negative.  Negative for fever, weight loss and malaise/fatigue.  Respiratory: Negative.  Negative for cough, hemoptysis and shortness of breath.   Cardiovascular: Negative.  Negative for chest pain.  Gastrointestinal: Positive for abdominal pain. Negative for nausea, vomiting, diarrhea, constipation, blood in stool and melena.  Genitourinary: Negative.   Musculoskeletal: Negative.   Neurological: Negative.  Negative for weakness.  Endo/Heme/Allergies: Does not bruise/bleed easily.  Psychiatric/Behavioral: Negative.  The patient is not nervous/anxious.     As per HPI. Otherwise, a complete review of systems is negatve.  PAST MEDICAL HISTORY: Past Medical History  Diagnosis Date  . COPD (chronic obstructive pulmonary disease) ([place])   . Chronic atrial fibrillation (HCC)     a. not on long term full-dose anticoagulaiton 2/2 thrombocytopenia;  CHADS2VASc 0 based on available data  . Anxiety   . GERD (gastroesophageal reflux disease)   . Arthritis   . Asthma   . Depression   . Gout   . ESRD on dialysis ([place])   . Cirrhosis ([place])   . Stomach ulcer   . Hepatitis C   . Incarceration   . Anemia of chronic disease   . Thrombocytopenia ([place])     PAST SURGICAL HISTORY: Past Surgical History  Procedure Laterality Date  . Peritoneal wound drainage  2015,2016    gangreen    . Cardiac defibrillator placement  2015  . Dialysis fistula creation  2014  . Peripheral vascular catheterization Left 08/21/2015    Procedure: A/V Shuntogram/Fistulagram;  Surgeon: Katha Cabal, MD;  Location: [facility];  Service: Cardiovascular;  Laterality: Left;  . Peripheral vascular catheterization N/A 08/21/2015    Procedure: A/V Shunt Intervention;  Surgeon: Katha Cabal, MD;  Location: [facility];  Service: Cardiovascular;  Laterality: N/A;    FAMILY HISTORY: Family History  Problem Relation Age of Onset  . Kidney cancer Neg Hx   . Prostate cancer Neg Hx        ADVANCED DIRECTIVES:    HEALTH MAINTENANCE: Social History  Substance Use Topics  . Smoking status: Current Every Day Smoker -- 1.00 packs/day    Types: Cigarettes  . Smokeless tobacco: Not on file  . Alcohol Use: 0.0 oz/week    0 Standard drinks or equivalent per week     Comment: drinks beer     Colonoscopy:  PAP:  Bone density:  Lipid panel:  No Known Allergies  No current facility-administered medications for this visit.   No current outpatient prescriptions on  file.   Facility-Administered Medications Ordered in Other Visits  Medication Dose Route Frequency Provider Last Rate Last Dose  . 0.9 %  sodium chloride infusion  250 mL Intravenous PRN Saundra Shelling, MD      . acetaminophen (TYLENOL) tablet 650 mg  650 mg Oral Q4H PRN Pavan Pyreddy, MD      . albuterol (PROVENTIL) (2.5 MG/3ML) 0.083% nebulizer solution 2.5 mg  2.5 mg  Nebulization Q6H PRN Lenis Noon, RPH      . aspirin EC tablet 81 mg  81 mg Oral Daily Saundra Shelling, MD   81 mg at 09/10/15 0915  . bisacodyl (DULCOLAX) suppository 10 mg  10 mg Rectal Daily PRN Saundra Shelling, MD      . calcium carbonate (TUMS - dosed in mg elemental calcium) chewable tablet 400 mg of elemental calcium  2 tablet Oral QID PRN Saundra Shelling, MD      . docusate sodium (COLACE) capsule 100 mg  100 mg Oral BID Saundra Shelling, MD   100 mg at 09/10/15 0913  . famotidine (PEPCID) tablet 20 mg  20 mg Oral Daily Saundra Shelling, MD   20 mg at 09/10/15 0913  . fluticasone (FLONASE) 50 MCG/ACT nasal spray 1 spray  1 spray Each Nare Daily Lenis Noon, [facility]      . folic acid (FOLVITE) tablet 1 mg  1 mg Oral Daily Saundra Shelling, MD   1 mg at 09/10/15 0915  . furosemide (LASIX) tablet 40 mg  40 mg Oral BID Saundra Shelling, MD      . hydrocortisone 2.5 % cream 1 application  1 application Topical BID Pavan Pyreddy, MD      . metoprolol tartrate (LOPRESSOR) tablet 25 mg  25 mg Oral BID Pavan Pyreddy, MD      . nitroGLYCERIN (NITROSTAT) SL tablet 0.4 mg  0.4 mg Sublingual Q5 Min x 3 PRN Pavan Pyreddy, MD      . ondansetron (ZOFRAN) injection 4 mg  4 mg Intravenous Q6H PRN Pavan Pyreddy, MD      . oxyCODONE (Oxy IR/ROXICODONE) immediate release tablet 10 mg  10 mg Oral Q6H PRN Hillary Bow, MD   10 mg at 09/10/15 0915  . pramoxine (PROCTOFOAM) 1 % foam 1 application  1 application Rectal TID PRN Saundra Shelling, MD      . QUEtiapine (SEROQUEL) tablet 100 mg  100 mg Oral BID Pavan Pyreddy, MD      . sevelamer carbonate (RENVELA) tablet 800 mg  800 mg Oral TID WC Saundra Shelling, MD   800 mg at 09/10/15 0912  . sodium chloride flush (NS) 0.9 % injection 3 mL  3 mL Intravenous Q12H Pavan Pyreddy, MD      . sodium chloride flush (NS) 0.9 % injection 3 mL  3 mL Intravenous PRN Pavan Pyreddy, MD      . traMADol (ULTRAM) tablet 50 mg  50 mg Oral Q6H PRN Pavan Pyreddy, MD      . vitamin B-12 (CYANOCOBALAMIN)  tablet 1,000 mcg  1,000 mcg Oral Daily Saundra Shelling, MD        OBJECTIVE: Filed Vitals:   09/03/15 1516  BP: 130/72  Pulse: 73  Temp: 97 F (36.1 C)  Resp: 18     Body mass index is 25.06 kg/(m^2).    ECOG FS:0 - Asymptomatic  General: Well-developed, well-nourished, no acute distress. Eyes: Pink conjunctiva, anicteric sclera. HEENT: Normocephalic, moist mucous membranes, clear oropharnyx. Lungs: Clear to auscultation bilaterally. Heart: Regular rate  and rhythm. No rubs, murmurs, or gallops. Abdomen: Soft, nontender, nondistended. No organomegaly noted, normoactive bowel sounds. Musculoskeletal: No edema, cyanosis, or clubbing. Dialysis fistula noted in left upper arm. Neuro: Alert, answering all questions appropriately. Cranial nerves grossly intact. Skin: No rashes or petechiae noted. Psych: Normal affect. Lymphatics: No cervical, calvicular, axillary or inguinal LAD.   LAB RESULTS:  Lab Results  Component Value Date   NA 139 09/10/2015   K 2.5* 09/10/2015   CL 106 09/10/2015   CO2 18* 09/10/2015   GLUCOSE 117* 09/10/2015   BUN 47* 09/10/2015   CREATININE 7.68* 09/10/2015   CALCIUM 8.6* 09/10/2015   PROT 6.5 08/15/2015   ALBUMIN 3.2* 08/15/2015   AST 75* 08/15/2015   ALT 91* 08/15/2015   ALKPHOS 72 08/15/2015   BILITOT 0.4 08/15/2015   GFRNONAA 7* 09/10/2015   GFRAA 8* 09/10/2015    Lab Results  Component Value Date   WBC 3.4* 09/10/2015   NEUTROABS 1.3* 08/15/2015   HGB 12.8* 09/10/2015   HCT 36.7* 09/10/2015   MCV 99.0 09/10/2015   PLT 47* 09/10/2015     STUDIES: Ct Abdomen Pelvis Wo Contrast  08/15/2015  CLINICAL DATA:  Lower abdominal pain with nausea, vomiting and diarrhea for 2 days, history COPD, CHF, MI, asthma, atrial fibrillation, smoker, liver disease EXAM: CT ABDOMEN AND PELVIS WITHOUT CONTRAST TECHNIQUE: Multidetector CT imaging of the abdomen and pelvis was performed following the standard protocol without IV contrast. Sagittal and coronal  MPR images reconstructed from axial data set. Patient drank dilute oral contrast for exam. COMPARISON:  None FINDINGS: Lower chest:  Dependent bibasilar atelectasis. Hepatobiliary: Calcified gallstones within gallbladder. Nodular cirrhotic appearing liver. No focal hepatic mass or definite biliary dilatation. Pancreas: Normal appearance Spleen: Enlarged, 17.6 cm length with calculated volume 1156 mL. No focal abnormalities. Adrenals/Urinary Tract: Adrenal glands normal appearance. BILATERAL atrophic kidneys. No gross evidence of renal mass, hydronephrosis, hydroureter, or urinary tract calcification. Unremarkable bladder. Minimal prostatic enlargement. Stomach/Bowel: Normal appendix. Scattered stool throughout colon. Stomach decompressed with suboptimal assessment of wall thickness. Small bowel loops unremarkable. No bowel dilatation, bowel wall thickening or evidence of obstruction. Small hiatal hernia with questionable wall thickening of distal esophagus, unable to exclude mass. 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 Vascular/Lymphatic: Scattered atherosclerotic calcification aorta. No adenopathy. Reproductive: N/A Other: No free air or free fluid. No hernia. Diffuse infiltrative change/edema involving sigmoid mesocolon, small bowel mesentery, pelvic soft tissue planes, and celiac axis. This is nonspecific but could reflect a combination of portal hypertension and hypoproteinemia as a result of cirrhosis. No discrete abscess or source of an acute inflammatory process is identified. Musculoskeletal: No acute osseous findings. 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 Cannot exclude esophageal wall thickening near a visualize small hiatal hernia; endoscopic follow-up recommended to exclude tumor. Scattered edema within sigmoid mesocolon, pelvic soft tissue planes, small bowel mesenteries celiac axis question reflecting portal hypertension and potentially hypoproteinemia.  Cholelithiasis. BILATERAL renal cortical atrophy. Electronically Signed   By: Lavonia Dana M.D.   On: 08/15/2015 17:50   Dg Chest Port 1 View  09/10/2015  CLINICAL DATA:  4-5 days of LEFT chest pain, vomiting and weakness. History of COPD, heart attack. EXAM: PORTABLE CHEST 1 VIEW COMPARISON:  CT chest Jun 30, 2015 FINDINGS: Re- demonstration of spiculated LEFT upper lobe nodule. Increased lung volumes compatible with COPD Common no pleural effusion or focal consolidation. No pneumothorax. Cardiomediastinal silhouette appears normal. Loop monitor projecting LEFT chest. Remote RIGHT distal clavicle resection and shoulder separation. IMPRESSION:  Re- demonstration LEFT upper lobe lung nodule, previous recommendation for PET-CT stands. COPD. Electronically Signed   By: Elon Alas M.D.   On: 09/10/2015 03:15    ASSESSMENT: Left upper lobe pulmonary nodule, pancytopenia, thrombocytopenia.  PLAN:    1. Left upper lobe pulmonary nodule: Given patients extensive tobacco history this is highly suspicious for malignancy. Will repeat CT scan 3 months after initial scan in mid-August to assess for interval change.  If it increases in size, will get PET scan and consider a referral to thoracic surgery. Return to clinic 1-2 days after his scan for further evaluation and discussion of his imaging results. 2. Pancytopenia: Mild. All of patient's other lab work is either negative or within normal limits. Likely multifactorial including dialysis, cirrhosis and splenomegaly.  Patient does not require a bone marrow biopsy at this time.  No intervention is needed. 3. Thrombocytopenia: Decreased but stable. Secondary to cirrhosis, splenomegaly, and positive platelet antibodies. Although these antibodies can be transient in nature. No further intervention is needed at this time. Return to clinic as above. 4.  Renal failure: Continue dialysis as indicated. 5.  Hypokalemia: Replacement as needed with dialysis.  Patient  expressed understanding and was in agreement with this plan. He also understands that He can call clinic at any time with any questions, concerns, or complaints.    Lloyd Huger, MD   09/10/2015 9:28 AM

## 2015-09-10 NOTE — Progress Notes (Signed)
Post HD  

## 2015-09-10 NOTE — Progress Notes (Signed)
Pt requests a dose of prn pain medication for pain 10/10. MD notified. Order for one time dose received. I will continue to assess.

## 2015-09-10 NOTE — Discharge Summary (Signed)
[facility] NAME: Richard Herring    MR#:  [id number]  DATE OF BIRTH:  Nov 12, 1955  DATE OF ADMISSION:  09/10/2015 ADMITTING PHYSICIAN: Hillary Bow, MD  DATE OF DISCHARGE: 09/10/2015  PRIMARY CARE PHYSICIAN: Murlean Iba, MD   ADMISSION DIAGNOSIS:  chest pain  DISCHARGE DIAGNOSIS:  Principal Problem:   Musculoskeletal chest pain Active Problems:   Chronic atrial fibrillation (HCC)   ESRD (end stage renal disease) on dialysis (HCC)   Hypokalemia   Status post placement of implantable loop recorder   COPD (chronic obstructive pulmonary disease) (HCC)   Cirrhosis of liver (HCC)   Hepatitis C   Anemia of chronic disease   Thrombocytopenia (HCC)   Left sided chest pain   SECONDARY DIAGNOSIS:   Past Medical History  Diagnosis Date  . COPD (chronic obstructive pulmonary disease) ([place])   . Chronic atrial fibrillation (HCC)     a. not on long term full-dose anticoagulaiton 2/2 thrombocytopenia; CHADS2VASc 0 based on available data  . Anxiety   . GERD (gastroesophageal reflux disease)   . Arthritis   . Asthma   . Depression   . Gout   . ESRD on dialysis ([place])   . Cirrhosis ([place])   . Stomach ulcer   . Hepatitis C   . Incarceration   . Anemia of chronic disease   . Thrombocytopenia ([place])      ADMITTING HISTORY  HISTORY OF PRESENT ILLNESS: Richard Herring is a 60 y.o. male with a known history of Chronic obstructive pulmonary disease, end-stage renal disease on dialysis, chronic atrial fibrillation, GERD, arthritis, gout, congestive heart failure presented to the emergency room with chest pain. Patient was trying to fix a ceiling fan in the porch and he slipped and fell down and hit the left side of the chest wall. Patient has a defibrillator placed in the left-sided chest. Patient has soreness in the left chest wall and has chest pain. The pain is sharp in nature 7 out of 10 on a scale of 1-10. Patient is on dialysis  and gets dialyzed on Tuesday and Thursday and Saturday. Has occasional cough but no fever or chills. No history of shortness of breath. No history of orthopnea or proximal nocturnal dyspnea. Potassium was low in the emergency room and it was supplemented orally. EKG showed a ST segment depression. First set of troponin was negative. Hospitalist service was consulted for further care of the patient. No loss of consciousness or head injury. No history of any seizure. Tenderness on palpation of left chest wall.  HOSPITAL COURSE:   * Left chest pain due to fall. Rib series showed no frcatures. Troponin normal. Echo normal. Seen by cardiology  * Left lung mass Seen by Dr. Grayland Ormond with oncology. F/u in 3-4 weeks for repeat CT chest  * CHronic atrial fibrillation is stable  * ESRD HD done today  stable for discharge to group home  CONSULTS OBTAINED:  Treatment Team:  Minna Merritts, MD Murlean Iba, MD Lequita Asal, MD  DRUG ALLERGIES:  No Known Allergies  DISCHARGE MEDICATIONS:   Current Discharge Medication List    START taking these medications   Details  oxyCODONE 10 MG TABS Take 1 tablet (10 mg total) by mouth every 8 (eight) hours as needed for severe pain. Qty: 10 tablet, Refills: 0      CONTINUE these medications which have CHANGED   Details  albuterol (PROVENTIL HFA;VENTOLIN HFA) 108 (90 Base) MCG/ACT inhaler  Inhale 2 puffs into the lungs 4 (four) times daily as needed for wheezing or shortness of breath. Qty: 1 Inhaler, Refills: 0    docusate sodium (COLACE) 100 MG capsule Take 1 capsule (100 mg total) by mouth 2 (two) times daily as needed for mild constipation. Qty: 20 capsule, Refills: 0    folic acid (FOLVITE) 1 MG tablet Take 1 tablet (1 mg total) by mouth daily. Qty: 30 tablet, Refills: 0    furosemide (LASIX) 40 MG tablet Take 1 tablet (40 mg total) by mouth 2 (two) times daily. Qty: 60 tablet, Refills: 0    hydrocortisone 2.5 % cream Apply 1  application topically 2 (two) times daily as needed. Qty: 20 g, Refills: 0    metoprolol (LOPRESSOR) 50 MG tablet Take 1 tablet (50 mg total) by mouth 2 (two) times daily. Reported on 08/21/2015 Qty: 60 tablet, Refills: 0    pramoxine (PROCTOFOAM) 1 % foam Place 1 application rectally 3 (three) times daily as needed for itching. Qty: 15 g, Refills: 0    QUEtiapine (SEROQUEL) 100 MG tablet Take 1 tablet (100 mg total) by mouth 2 (two) times daily. Qty: 60 tablet, Refills: 0    ranitidine (ZANTAC) 300 MG tablet Take 1 tablet (300 mg total) by mouth 2 (two) times daily. Qty: 60 tablet, Refills: 0    sevelamer carbonate (RENVELA) 800 MG tablet Take 1 tablet (800 mg total) by mouth 3 (three) times daily with meals. Qty: 90 tablet, Refills: 0    triamcinolone (NASACORT ALLERGY 24HR) 55 MCG/ACT AERO nasal inhaler Place 1 spray into the nose daily. Qty: 1 Inhaler, Refills: 0    vitamin B-12 (CYANOCOBALAMIN) 1000 MCG tablet Take 1 tablet (1,000 mcg total) by mouth daily. Qty: 90 tablet, Refills: 0      CONTINUE these medications which have NOT CHANGED   Details  aspirin EC 81 MG tablet Take 81 mg by mouth daily. Reported on 08/06/2015    calcium carbonate (TUMS - DOSED IN MG ELEMENTAL CALCIUM) 500 MG chewable tablet Chew 2 tablets by mouth 4 (four) times daily as needed for indigestion or heartburn.      STOP taking these medications     bisacodyl (DULCOLAX) 10 MG suppository      traMADol (ULTRAM) 50 MG tablet         Today   VITAL SIGNS:  Blood pressure 112/67, pulse 78, temperature 97.8 F (36.6 C), temperature source Oral, resp. rate 20, height '5\' 11"'$  (1.803 m), weight 79.742 kg (175 lb 12.8 oz), SpO2 100 %.  I/O:   Intake/Output Summary (Last 24 hours) at 09/10/15 1539 Last data filed at 09/10/15 1154  Gross per 24 hour  Intake    360 ml  Output      0 ml  Net    360 ml    PHYSICAL EXAMINATION:  Physical Exam  GENERAL:  60 y.o.-year-old patient lying in the bed  with no acute distress.  LUNGS: Normal breath sounds bilaterally, no wheezing, rales,rhonchi or crepitation. No use of accessory muscles of respiration.  CARDIOVASCULAR: S1, S2 normal. No murmurs, rubs, or gallops.  ABDOMEN: Soft, non-tender, non-distended. Bowel sounds present. No organomegaly or mass.  NEUROLOGIC: Moves all 4 extremities. PSYCHIATRIC: The patient is alert and oriented x 3.  SKIN: No obvious rash, lesion, or ulcer.   DATA REVIEW:   CBC  Recent Labs Lab 09/10/15 0252  WBC 3.4*  HGB 12.8*  HCT 36.7*  PLT 47*    Chemistries  Recent Labs Lab 09/10/15 0252 09/10/15 0852  NA 139  --   K 2.5* 3.1*  CL 106  --   CO2 18*  --   GLUCOSE 117*  --   BUN 47*  --   CREATININE 7.68*  --   CALCIUM 8.6*  --     Cardiac Enzymes  Recent Labs Lab 09/10/15 0852  TROPONINI <0.03    Microbiology Results  Results for orders placed or performed during the hospital encounter of 08/15/15  Culture, blood (Routine X 2) w Reflex to ID Panel     Status: None   Collection Time: 08/15/15  4:41 PM  Result Value Ref Range Status   Specimen Description BLOOD RIGHT HAND  Final   Special Requests   Final    BOTTLES DRAWN AEROBIC AND ANAEROBIC  AEROBIC 8CC, ANAEROBIC 4CC   Culture NO GROWTH 5 DAYS  Final   Report Status 08/20/2015 FINAL  Final  Culture, blood (Routine X 2) w Reflex to ID Panel     Status: None   Collection Time: 08/15/15  4:42 PM  Result Value Ref Range Status   Specimen Description BLOOD RIGHT ASSIST CONTROL  Final   Special Requests BOTTLES DRAWN AEROBIC AND ANAEROBIC  4CC  Final   Culture NO GROWTH 5 DAYS  Final   Report Status 08/20/2015 FINAL  Final  MRSA PCR Screening     Status: Abnormal   Collection Time: 08/15/15  7:07 PM  Result Value Ref Range Status   MRSA by PCR POSITIVE (A) NEGATIVE Final    Comment:        The GeneXpert MRSA Assay (FDA approved for NASAL specimens only), is one component of a comprehensive MRSA  colonization surveillance program. It is not intended to diagnose MRSA infection nor to guide or monitor treatment for MRSA infections. CRITICAL RESULT CALLED TO, READ BACK BY AND VERIFIED WITH: DAWN SONGSTER AT 2058 08/15/15.PMH     RADIOLOGY:  Dg Ribs Unilateral Left  09/10/2015  CLINICAL DATA:  Right-sided rib pain. EXAM: LEFT RIBS - 2 VIEW COMPARISON:  Chest x-ray 09/10/2015 and chest CT 06/30/2015 FINDINGS: The cardiac silhouette, mediastinal and hilar contours are normal in stable. Stable emphysematous changes and pulmonary scarring. Stable left upper lobe pulmonary lesion. The loop recorder is noted on the left. No infiltrates, edema or effusions. Dedicated views of the left ribs do not demonstrate any definite acute left-sided rib fractures. IMPRESSION: No acute cardiopulmonary findings. Stable emphysematous changes and pulmonary scarring. Stable left upper lobe pulmonary lesion. No definite acute left-sided rib fractures or bone lesions. Electronically Signed   By: Marijo Sanes M.D.   On: 09/10/2015 12:06   Dg Chest Port 1 View  09/10/2015  CLINICAL DATA:  4-5 days of LEFT chest pain, vomiting and weakness. History of COPD, heart attack. EXAM: PORTABLE CHEST 1 VIEW COMPARISON:  CT chest Jun 30, 2015 FINDINGS: Re- demonstration of spiculated LEFT upper lobe nodule. Increased lung volumes compatible with COPD Common no pleural effusion or focal consolidation. No pneumothorax. Cardiomediastinal silhouette appears normal. Loop monitor projecting LEFT chest. Remote RIGHT distal clavicle resection and shoulder separation. IMPRESSION: Re- demonstration LEFT upper lobe lung nodule, previous recommendation for PET-CT stands. COPD. Electronically Signed   By: Elon Alas M.D.   On: 09/10/2015 03:15    Follow up with PCP in 1 week.  Management plans discussed with the patient, family and they are in agreement.  CODE STATUS:     Code Status Orders  Start     Ordered   09/10/15  0753  Full code   Continuous     09/10/15 0752    Code Status History    Date Active Date Inactive Code Status Order ID Comments User Context   08/15/2015  5:54 PM 08/16/2015  4:05 PM Full Code [id number]  Bettey Costa, MD Inpatient   07/01/2015  5:01 PM 07/02/2015  9:56 PM Full Code [id number]  Nicholes Mango, MD Inpatient      TOTAL TIME TAKING CARE OF THIS PATIENT ON DAY OF DISCHARGE: more than 30 minutes.   Hillary Bow R M.D on 09/10/2015 at 3:39 PM  Between 7am to 6pm - Pager - [phone]  After 6pm go to [email or web address] - password [email or web address] [facility]  Office  [phone]  CC: Primary care physician; Murlean Iba, MD  Note: This dictation was prepared with Dragon dictation along with smaller phrase technology. Any transcriptional errors that result from this process are unintentional.

## 2015-09-11 NOTE — Consult Note (Signed)
[facility]  Date of admission:  09/10/2015  Inpatient day:  09/10/2015  Consulting physician:  Dr. Murlean Iba  Reason for Consultation:   Lung mass, thrombocytopenia.  Chief Complaint: Richard Herring is a 60 y.o. male with ESRD on dialysis, hepatitis B and C, cirrhosis, and splenomegaly who was admitted with chest pain.  HPI:  The patient was admitted with chest pain following a fall.  He struck the left side of his chest where he has a defibrillator.  EKG showed ST depression.  Tropinins were negative.  Rib films revealed stable left upper lobe pulmonary lesion.  Platelet count was 47,000.  Chest CT from 06/30/2015 revealed a 2.1 x 1.5 cm nodular lesion in the left upper lobe highly suspicious for malignancy.  Abdominal CT scan from 08/15/2015 revealed nodular cirrhotic liver with paraesophageal varices and splenomegaly (17.6 cm).  Available labs reveals a platelet count ranging between 45,00 - 61,000 between 06/30/2015 and 09/03/2015. He is both hepatitis C and B+. Hepatitis C testing was positive on 07/02/2015. Hepatitis B core antibody total was positive on 07/01/2015.  Hepatitis C RNA quantitative was 5,600,000  IU/ml (6.748 log 10 IU/ml).  He underwent a workup in the outpatient department for his anemia and thrombocytopenia by Dr. Grayland Ormond. Labs have included a normal ferritin, iron saturation, B12, folate, SPEP, haptoglobin, LDH, and neutrophil antibody. Platelet antibodies were positive on 09/03/2015.  He notes a history of smoking 2 packs per day for 25-30 years, stopping then smoking 1 1/2 packs per day for the past 3 months.    Symptomatically, he has lost a 70 pounds in the past year.  He notes rare hemoptysis.  He denies any excess bruising or bleeding.  He denies any new medications or herbal products.    Past Medical History  Diagnosis Date  . COPD (chronic obstructive pulmonary disease) ([place])   . Chronic atrial fibrillation (HCC)     a. not on long  term full-dose anticoagulaiton 2/2 thrombocytopenia; CHADS2VASc 0 based on available data  . Anxiety   . GERD (gastroesophageal reflux disease)   . Arthritis   . Asthma   . Depression   . Gout   . ESRD on dialysis ([place])   . Cirrhosis ([place])   . Stomach ulcer   . Hepatitis C   . Incarceration   . Anemia of chronic disease   . Thrombocytopenia [facility])     Past Surgical History  Procedure Laterality Date  . Peritoneal wound drainage  2015,2016    gangreen    . Cardiac defibrillator placement  2015  . Dialysis fistula creation  2014  . Peripheral vascular catheterization Left 08/21/2015    Procedure: A/V Shuntogram/Fistulagram;  Surgeon: Katha Cabal, MD;  Location: [place] [facility];  Service: Cardiovascular;  Laterality: Left;  . Peripheral vascular catheterization N/A 08/21/2015    Procedure: A/V Shunt Intervention;  Surgeon: Katha Cabal, MD;  Location: [place] [facility];  Service: Cardiovascular;  Laterality: N/A;    Family History  Problem Relation Age of Onset  . Kidney cancer Neg Hx   . Prostate cancer Neg Hx     Social History:  reports that he has been smoking Cigarettes.  He has been smoking about 1.00 pack per day. He does not have any smokeless tobacco history on file. He reports that he drinks alcohol. He reports that he does not use illicit drugs.  He states that he previously drank 24 hours/day 7 days a week.  He was  in prison for 11 years and 8 months.  He has been out of prison for 3 months.  He has 2 children that he hasn't seen in 30 years.  He lives in [place] "here and there".  He is alone today.  Allergies: No Known Allergies  Medications Prior to Admission  Medication Sig Dispense Refill  . aspirin EC 81 MG tablet Take 81 mg by mouth daily. Reported on 08/06/2015    . bisacodyl (DULCOLAX) 10 MG suppository Place 10 mg rectally daily as needed for moderate constipation.    . calcium carbonate (TUMS - DOSED IN MG ELEMENTAL CALCIUM) 500 MG  chewable tablet Chew 2 tablets by mouth 4 (four) times daily as needed for indigestion or heartburn.    . traMADol (ULTRAM) 50 MG tablet Take 50 mg by mouth every 6 (six) hours as needed.     . [DISCONTINUED] albuterol (PROVENTIL HFA;VENTOLIN HFA) 108 (90 Base) MCG/ACT inhaler Inhale 2 puffs into the lungs 4 (four) times daily as needed for wheezing or shortness of breath.    . [DISCONTINUED] docusate sodium (COLACE) 100 MG capsule Take 100 mg by mouth 2 (two) times daily.    . [DISCONTINUED] folic acid (FOLVITE) 1 MG tablet Take 1 mg by mouth daily.    . [DISCONTINUED] furosemide (LASIX) 40 MG tablet Take 40 mg by mouth 2 (two) times daily.     . [DISCONTINUED] hydrocortisone 2.5 % cream Apply 1 application topically 2 (two) times daily as needed.    . [DISCONTINUED] metoprolol (LOPRESSOR) 50 MG tablet Take 50 mg by mouth 3 (three) times daily. Reported on 08/21/2015    . [DISCONTINUED] pramoxine (PROCTOFOAM) 1 % foam Place 1 application rectally 3 (three) times daily as needed for itching.    . [DISCONTINUED] QUEtiapine (SEROQUEL) 100 MG tablet Take 100 mg by mouth 2 (two) times daily.    . [DISCONTINUED] ranitidine (ZANTAC) 300 MG tablet Take 300 mg by mouth 2 (two) times daily.    . [DISCONTINUED] sevelamer carbonate (RENVELA) 800 MG tablet Take 800 mg by mouth 3 (three) times daily with meals.    . [DISCONTINUED] triamcinolone (NASACORT ALLERGY 24HR) 55 MCG/ACT AERO nasal inhaler Place 1 spray into the nose daily.    . [DISCONTINUED] vitamin B-12 (CYANOCOBALAMIN) 1000 MCG tablet Take 1,000 mcg by mouth daily.      Review of Systems: GENERAL:  Feels "ok".  No fevers or sweats.  Weight loss of 70 pounds in < 1 year. PERFORMANCE STATUS (ECOG):  1 HEENT:  No visual changes, runny nose, sore throat, mouth sores or tenderness. Lungs: No shortness of breath.  Chronic cough.  Rare hemoptysis. Cardiac:  Chest pain s/p fall.  No palpitations, orthopnea, or PND. GI:  Nausea, at times.  No vomiting,  diarrhea, constipation, melena or hematochezia. GU:  No urgency, frequency, dysuria, or hematuria. Musculoskeletal:  No back pain.  No joint pain.  No muscle tenderness. Extremities:  No pain or swelling. Skin:  No rashes or skin changes. Neuro:  No headache, numbness or weakness, balance or coordination issues. Endocrine:  No diabetes, thyroid issues, hot flashes or night sweats. Psych:  No mood changes, depression or anxiety. Pain:  No focal pain. Review of systems:  All other systems reviewed and found to be negative.  Physical Exam:  Blood pressure 125/67, pulse 92, temperature 98.3 F (36.8 C), temperature source Oral, resp. rate 19, height 5' 11" (1.803 m), weight 176 lb 9.6 oz (80.105 kg), SpO2 100 %.  GENERAL:  Well  developed, well nourished, gentleman sitting comfortably on the medical unit in no acute distress. MENTAL STATUS:  Alert and oriented to person, place and time. HEAD:  Crista Elliot a baseball cap.  Brown hair.  Mustache.  Normocephalic, atraumatic, face symmetric, no Cushingoid features. EYES:  Blue eyes.  Pupils equal round and reactive to light and accomodation.  No conjunctivitis or scleral icterus. ENT:  Oropharynx clear without lesion.  Tongue normal. Mucous membranes moist.  RESPIRATORY:  Clear to auscultation without rales, wheezes or rhonchi. CARDIOVASCULAR:  Regular rate and rhythm without murmur, rub or gallop. ABDOMEN:  Soft, non-tender, with active bowel sounds, and no hepatomegaly.  Spleen palpable.  No masses. SKIN:  No rashes, ulcers or lesions. EXTREMITIES: Dialysis catheter LUE.  No edema, no skin discoloration or tenderness.  No palpable cords. LYMPH NODES: No palpable cervical, supraclavicular, axillary or inguinal adenopathy  NEUROLOGICAL: Unremarkable. PSYCH:  Appropriate.  Results for orders placed or performed during the hospital encounter of 09/10/15 (from the past 48 hour(s))  Basic metabolic panel     Status: Abnormal   Collection Time: 09/10/15   2:52 AM  Result Value Ref Range   Sodium 139 135 - 145 mmol/L   Potassium 2.5 (LL) 3.5 - 5.1 mmol/L    Comment: CRITICAL RESULT CALLED TO, READ BACK BY AND VERIFIED WITH BUTCH WOODS @ 0356 ON 09/10/2015 BY CAF    Chloride 106 101 - 111 mmol/L   CO2 18 (L) 22 - 32 mmol/L   Glucose, Bld 117 (H) 65 - 99 mg/dL   BUN 47 (H) 6 - 20 mg/dL   Creatinine, Ser 7.68 (H) 0.61 - 1.24 mg/dL   Calcium 8.6 (L) 8.9 - 10.3 mg/dL   GFR calc non Af Amer 7 (L) >60 mL/min   GFR calc Af Amer 8 (L) >60 mL/min    Comment: (NOTE) The eGFR has been calculated using the CKD EPI equation. This calculation has not been validated in all clinical situations. eGFR's persistently <60 mL/min signify possible Chronic Kidney Disease.    Anion gap 15 5 - 15  CBC     Status: Abnormal   Collection Time: 09/10/15  2:52 AM  Result Value Ref Range   WBC 3.4 (L) 3.8 - 10.6 K/uL   RBC 3.70 (L) 4.40 - 5.90 MIL/uL   Hemoglobin 12.8 (L) 13.0 - 18.0 g/dL   HCT 36.7 (L) 40.0 - 52.0 %   MCV 99.0 80.0 - 100.0 fL   MCH 34.4 (H) 26.0 - 34.0 pg   MCHC 34.8 32.0 - 36.0 g/dL   RDW 15.1 (H) 11.5 - 14.5 %   Platelets 47 (L) 150 - 440 K/uL  Troponin I     Status: None   Collection Time: 09/10/15  2:52 AM  Result Value Ref Range   Troponin I <0.03 <0.03 ng/mL  Ethanol     Status: Abnormal   Collection Time: 09/10/15  2:52 AM  Result Value Ref Range   Alcohol, Ethyl (B) 19 (H) <5 mg/dL    Comment:        LOWEST DETECTABLE LIMIT FOR SERUM ALCOHOL IS 5 mg/dL FOR MEDICAL PURPOSES ONLY   Potassium     Status: Abnormal   Collection Time: 09/10/15  8:52 AM  Result Value Ref Range   Potassium 3.1 (L) 3.5 - 5.1 mmol/L  Troponin I     Status: None   Collection Time: 09/10/15  8:52 AM  Result Value Ref Range   Troponin I <0.03 <0.03 ng/mL  Hemoglobin  A1c     Status: None   Collection Time: 09/10/15  8:52 AM  Result Value Ref Range   Hgb A1c MFr Bld 5.4 4.0 - 6.0 %  Troponin I     Status: None   Collection Time: 09/10/15  3:30 PM   Result Value Ref Range   Troponin I <0.03 <0.03 ng/mL  Magnesium     Status: None   Collection Time: 09/10/15  3:30 PM  Result Value Ref Range   Magnesium 2.2 1.7 - 2.4 mg/dL  Phosphorus     Status: Abnormal   Collection Time: 09/10/15  3:30 PM  Result Value Ref Range   Phosphorus 6.6 (H) 2.5 - 4.6 mg/dL   Dg Ribs Unilateral Left  09/10/2015  CLINICAL DATA:  Right-sided rib pain. EXAM: LEFT RIBS - 2 VIEW COMPARISON:  Chest x-ray 09/10/2015 and chest CT 06/30/2015 FINDINGS: The cardiac silhouette, mediastinal and hilar contours are normal in stable. Stable emphysematous changes and pulmonary scarring. Stable left upper lobe pulmonary lesion. The loop recorder is noted on the left. No infiltrates, edema or effusions. Dedicated views of the left ribs do not demonstrate any definite acute left-sided rib fractures. IMPRESSION: No acute cardiopulmonary findings. Stable emphysematous changes and pulmonary scarring. Stable left upper lobe pulmonary lesion. No definite acute left-sided rib fractures or bone lesions. Electronically Signed   By: Marijo Sanes M.D.   On: 09/10/2015 12:06   Dg Chest Port 1 View  09/10/2015  CLINICAL DATA:  4-5 days of LEFT chest pain, vomiting and weakness. History of COPD, heart attack. EXAM: PORTABLE CHEST 1 VIEW COMPARISON:  CT chest Jun 30, 2015 FINDINGS: Re- demonstration of spiculated LEFT upper lobe nodule. Increased lung volumes compatible with COPD Common no pleural effusion or focal consolidation. No pneumothorax. Cardiomediastinal silhouette appears normal. Loop monitor projecting LEFT chest. Remote RIGHT distal clavicle resection and shoulder separation. IMPRESSION: Re- demonstration LEFT upper lobe lung nodule, previous recommendation for PET-CT stands. COPD. Electronically Signed   By: Elon Alas M.D.   On: 09/10/2015 03:15    Assessment:  The patient is a 60 y.o. gentleman with ESRD and COPD admitted with chest pain following a fall.    He has chronic  thrombocytopenia (platelet count 45,000 - 61,000) secondary to hepatitis B and C, cirrhosis and splenomegaly (17.6 cm).  He has a > 50 pack year smoking history.  Chest CT from 06/30/2015 revealed a 2.1 x 1.5 cm nodular lesion in the left upper lobe highly suspicious for malignancy. He is scheduled for follow-up in 09/2015 with Dr. Grayland Ormond for repeat chest CT, PET scan if nodule enlarging, and possible referral to thoracic surgery.  Plan:   1.  Hematology:  No intervention for chronic thrombocytopenia.  Discussed no aspirin or ibuprofen with patient.  If any significant bleeding, would transfuse platelets.  If platelet transfusion required, would check 1 hour post platelet count. 2.  Oncology:  Given patient's smoking history, COPD, and prior imaging, suspect LUL lesion is lung cancer.  Follow-up imaging scheduled for next month with Dr. Grayland Ormond.  3.  Disposition:  Per patient, discharge today.  Follow-up in oncology clinic per prior plan.  Thank you for allowing me to participate in Richard Herring 's care.  I will follow him closely with you while hospitalized.  He has an appointment with Dr. Grayland Ormond in the outpatient department after discharge.   Lequita Asal, MD  09/10/2015

## 2015-09-14 ENCOUNTER — Encounter: Payer: Self-pay | Admitting: Internal Medicine

## 2015-09-14 ENCOUNTER — Ambulatory Visit (INDEPENDENT_AMBULATORY_CARE_PROVIDER_SITE_OTHER): Payer: Medicaid Other | Admitting: Internal Medicine

## 2015-09-14 VITALS — BP 132/74 | HR 62 | Ht 70.0 in | Wt 173.6 lb

## 2015-09-14 DIAGNOSIS — J432 Centrilobular emphysema: Secondary | ICD-10-CM

## 2015-09-14 DIAGNOSIS — R911 Solitary pulmonary nodule: Secondary | ICD-10-CM | POA: Insufficient documentation

## 2015-09-14 DIAGNOSIS — J209 Acute bronchitis, unspecified: Secondary | ICD-10-CM

## 2015-09-14 MED ORDER — AZITHROMYCIN 250 MG PO TABS: ORAL_TABLET | ORAL | 0 refills | Status: DC

## 2015-09-14 MED ORDER — PREDNISONE 20 MG PO TABS: 20.0000 mg | ORAL_TABLET | Freq: Every day | ORAL | 0 refills | Status: DC

## 2015-09-14 NOTE — Assessment & Plan Note (Signed)
LUL 2.5cm x 1.5cm  Plan - tobacco cessation - has a PET/CT scheduled on 09/2015 - keep appointment - will discuss at [facility] after PET CT - possible work up with bronchoscopy after multidisciplinary conference in 09/2015

## 2015-09-14 NOTE — Progress Notes (Signed)
[facility] Consultation      MRN# [id number] Richard Herring Jan 12, 1956   CC: Chief Complaint  Patient presents with  . Hospitalization Follow-up    Pt recently hospitalized for chest pain.  pt c/o sob with exertion, chest pain, prod cough with lots of yellow/brown mucus.        Brief History: 60 year old past medical history of COPD, end-stage renal disease on hemodialysis, recently found to have a left upper lobe spiculated lung nodule, current smoker.left upper lobe measures at least 2.1 x 1.5 cm   Events since last clinic visit: Patient presents today for follow-up visit of his left upper lobe lung nodule, review of chart shows he had a recent hospitalization for chest pain.workup in the hospital showed normal troponin, normal echocardiogram, and seen by cardiology. He presents with transportation services. States he has a cough, whitish to brownish sputum production. No hemoptysis, no significant weight loss. He was recently in hospital as stated above. He has a 2.5 cm x 1.5 cm left upper lobe lung nodule. He has a CT chest scheduled on 10/07/2015.    [facility] Hospitalization ADMITTING HISTORY  HISTORY OF PRESENT ILLNESS: Richard Herring is a 60 y.o. male with a known history of Chronic obstructive pulmonary disease, end-stage renal disease on dialysis, chronic atrial fibrillation, GERD, arthritis, gout, congestive heart failure presented to the emergency room with chest pain. Patient was trying to fix a ceiling fan in the porch and he slipped and fell down and hit the left side of the chest wall. Patient has a defibrillator placed in the left-sided chest. Patient has soreness in the left chest wall and has chest pain. The pain is sharp in nature 7 out of 10 on a scale of 1-10. Patient is on dialysis and gets dialyzed on Tuesday and Thursday and Saturday. Has occasional cough but no fever or chills. No history of shortness of breath. No history of orthopnea or  proximal nocturnal dyspnea. Potassium was low in the emergency room and it was supplemented orally. EKG showed a ST segment depression. First set of troponin was negative. Hospitalist service was consulted for further care of the patient. No loss of consciousness or head injury. No history of any seizure. Tenderness on palpation of left chest wall.  HOSPITAL COURSE:   * Left chest pain due to fall. Rib series showed no frcatures. Troponin normal. Echo normal. Seen by cardiology  * Left lung mass Seen by Dr. Grayland Ormond with oncology. F/u in 3-4 weeks for repeat CT chest  * CHronic atrial fibrillation is stable  * ESRD HD done today  stable for discharge to group home    Medication:   Current Outpatient Rx  . Order #: [id number] Class: Normal  . Order #: [id number] Class: Historical Med  . Order #: [id number] Class: Historical Med  . Order #: [id number] Class: Print  . Order #: [id number] Class: Print  . Order #: [id number] Class: Print  . Order #: [id number] Class: Print  . Order #: [id number] Class: Print  . Order #: [id number] Class: Print  . Order #: [id number] Class: Print  . Order #: [id number] Class: Print  . Order #: [id number] Class: Historical Med  . Order #: [id number] Class: Print  . Order #: [id number] Class: Print  . Order #: [id number] Class: Historical Med  . Order #: [id number] Class: Normal  . Order #: [id number] Class: Normal     Review of Systems  Constitutional: Positive for weight loss. Negative for chills and fever.  HENT: Positive for hearing loss.   Eyes: Negative for blurred vision.  Respiratory:  Positive for cough, sputum production and shortness of breath. Negative for hemoptysis and wheezing.   Cardiovascular: Negative for chest pain.  Gastrointestinal: Positive for heartburn and nausea.  Genitourinary: Negative for dysuria.  Musculoskeletal: Negative for myalgias.  Neurological: Negative for dizziness and headaches.  Endo/Heme/Allergies: Does not bruise/bleed easily.       Allergies:  Review of patient's allergies indicates no known allergies.  Physical Examination:  VS: BP 132/74 (Cuff Size: Normal)   Pulse 62   Ht '5\' 10"'$  (1.778 m)   Wt 173 lb 9.6 oz (78.7 kg)   SpO2 99%   BMI 24.91 kg/m   General Appearance: No distress  HEENT: PERRLA, no ptosis, no other lesions noticed Pulmonary:normal breath sounds., diaphragmatic excursion normal.No wheezing, No rales   Cardiovascular:  Normal S1,S2.  No m/r/g.     Abdomen:Exam: Benign, Soft, non-tender, No masses  Skin:   warm, no rashes, no ecchymosis  Extremities: normal, no cyanosis, clubbing, warm with normal capillary refill.      Rad results: (The following images and results were reviewed by Dr. Stevenson Clinch on 09/14/15). CXR 09/10/15 CLINICAL DATA:  4-5 days of LEFT chest pain, vomiting and weakness. History of COPD, heart attack.  EXAM: PORTABLE CHEST 1 VIEW  COMPARISON:  CT chest Jun 30, 2015  FINDINGS: Re- demonstration of spiculated LEFT upper lobe nodule. Increased lung volumes compatible with COPD Common no pleural effusion or focal consolidation. No pneumothorax. Cardiomediastinal silhouette appears normal. Loop monitor projecting LEFT chest. Remote RIGHT distal clavicle resection and shoulder separation.  IMPRESSION: Re- demonstration LEFT upper lobe lung nodule, previous recommendation for PET-CT stands.  COPD.    Assessment and Plan:60 year old male with left upper lobe lung nodule, tobacco abuse, now with acute bronchitis Acute bronchitis Increase cough, sob, sputum production, +tobacoo use - currently 1.5ppd  Plan - zpak - prednisone '20mg'$  - 1 tab po daily x 5 day - avoid tobacco  COPD (chronic obstructive pulmonary disease) (HCC) COPD- clinically No pfts on file  Plan - cont with PRN albuterol - current with acute bronchitis - tobacco cessation counseling given today.   Pulmonary nodule, left LUL 2.5cm x 1.5cm  Plan - tobacco cessation - has a PET/CT  scheduled on 09/2015 - keep appointment - will discuss at [facility] after PET CT - possible work up with bronchoscopy after multidisciplinary conference in 09/2015    Updated Medication List Outpatient Encounter Prescriptions as of 09/14/2015  Medication Sig  . albuterol (PROVENTIL HFA;VENTOLIN HFA) 108 (90 Base) MCG/ACT inhaler Inhale 2 puffs into the lungs 4 (four) times daily as needed for wheezing or shortness of breath.  Marland Kitchen aspirin EC 81 MG tablet Take 81 mg by mouth daily. Reported on 08/06/2015  . calcium carbonate (TUMS - DOSED IN MG ELEMENTAL CALCIUM) 500 MG chewable tablet Chew 2 tablets by mouth 4 (four) times daily as needed for indigestion or heartburn.  . docusate sodium (COLACE) 100 MG capsule Take 1 capsule (100 mg total) by mouth 2 (two) times daily as needed for mild constipation.  . furosemide (LASIX) 40 MG tablet Take 1 tablet (40 mg total) by mouth 2 (two) times daily.  . hydrocortisone 2.5 % cream Apply 1 application topically 2 (two) times daily as needed.  . metoprolol (LOPRESSOR) 50 MG tablet Take 1 tablet (50 mg total) by mouth 2 (two) times daily. Reported on 08/21/2015  . pramoxine (PROCTOFOAM) 1 % foam Place 1 application rectally 3 (three) times daily as needed for itching.  Marland Kitchen QUEtiapine (SEROQUEL) 100 MG  tablet Take 1 tablet (100 mg total) by mouth 2 (two) times daily.  . ranitidine (ZANTAC) 300 MG tablet Take 1 tablet (300 mg total) by mouth 2 (two) times daily.  . sevelamer carbonate (RENVELA) 800 MG tablet Take 1 tablet (800 mg total) by mouth 3 (three) times daily with meals.  . traMADol (ULTRAM) 50 MG tablet Take 50 mg by mouth every 6 (six) hours as needed.  . triamcinolone (NASACORT ALLERGY 24HR) 55 MCG/ACT AERO nasal inhaler Place 1 spray into the nose daily.  . vitamin B-12 (CYANOCOBALAMIN) 1000 MCG tablet Take 1 tablet (1,000 mcg total) by mouth daily.  . vitamin C (ASCORBIC ACID) 500 MG tablet Take 500 mg by mouth daily.  Marland Kitchen azithromycin (ZITHROMAX Z-PAK) 250 MG  tablet Take 2 tabs today, then 1 daily until gone.  . predniSONE (DELTASONE) 20 MG tablet Take 1 tablet (20 mg total) by mouth daily.  . [DISCONTINUED] folic acid (FOLVITE) 1 MG tablet Take 1 tablet (1 mg total) by mouth daily. (Patient not taking: Reported on 09/14/2015)  . [DISCONTINUED] oxyCODONE 10 MG TABS Take 1 tablet (10 mg total) by mouth every 8 (eight) hours as needed for severe pain.   No facility-administered encounter medications on file as of 09/14/2015.     Orders for this visit: No orders of the defined types were placed in this encounter.   Thank  you for the visitation and for allowing  [facility] to assist in the care of your patient. Our recommendations are noted above.  Please contact us if we can be of further service.  Vilinda Boehringer, MD [facility] Office Number: [phone]  Note: This note was prepared with Dragon dictation along with smaller phrase technology. Any transcriptional errors that result from this process are unintentional.

## 2015-09-14 NOTE — Patient Instructions (Signed)
Follow up with Dr. Stevenson Clinch in:6 wks - prednisone 20 mg  - 1 tab po daily x 5 days - Zpak - use as directed - avoid tobacco

## 2015-09-14 NOTE — Assessment & Plan Note (Signed)
COPD- clinically No pfts on file  Plan - cont with PRN albuterol - current with acute bronchitis - tobacco cessation counseling given today.

## 2015-09-14 NOTE — Assessment & Plan Note (Signed)
Increase cough, sob, sputum production, +tobacoo use - currently 1.5ppd  Plan - zpak - prednisone '20mg'$  - 1 tab po daily x 5 day - avoid tobacco

## 2015-10-07 ENCOUNTER — Ambulatory Visit
Admission: RE | Admit: 2015-10-07 | Discharge: 2015-10-07 | Disposition: A | Discharge status: Home / Self Care | Payer: Medicaid Other | Source: Ambulatory Visit | Attending: Oncology | Admitting: Oncology

## 2015-10-07 DIAGNOSIS — K7460 Unspecified cirrhosis of liver: Secondary | ICD-10-CM | POA: Insufficient documentation

## 2015-10-07 DIAGNOSIS — R911 Solitary pulmonary nodule: Secondary | ICD-10-CM | POA: Diagnosis not present

## 2015-10-07 DIAGNOSIS — K766 Portal hypertension: Secondary | ICD-10-CM | POA: Diagnosis not present

## 2015-10-07 HISTORY — DX: Essential (primary) hypertension: I10

## 2015-10-07 HISTORY — DX: Disorder of kidney and ureter, unspecified: N28.9

## 2015-10-07 MED ORDER — IOPAMIDOL (ISOVUE-300) INJECTION 61%
75.0000 mL | Freq: Once | INTRAVENOUS | Status: AC | PRN
  Administered 2015-10-07: 75 mL via INTRAVENOUS

## 2015-10-12 ENCOUNTER — Telehealth: Payer: Self-pay | Admitting: *Deleted

## 2015-10-12 NOTE — Telephone Encounter (Signed)
Richard Herring is on vacation this week. I will try to reach Richard Herring and see if she can help

## 2015-10-12 NOTE — Telephone Encounter (Signed)
I believe Raquel Sarna has all of the smoking cessation as options available for patients through our CT screening program.

## 2015-10-12 NOTE — Telephone Encounter (Signed)
Called to state he is having trouble quitting smoking. Please advise

## 2015-10-13 ENCOUNTER — Telehealth: Payer: Self-pay

## 2015-10-13 NOTE — Telephone Encounter (Signed)
  Oncology Nurse Navigator Documentation  Navigator Location: [facility] On[facility] (10/13/15 1500) Navigator Encounter Type: Diagnostic Results;Education (10/13/15 1500)               Barriers/Navigation Needs: Education (10/13/15 1500)                          Time Spent with Patient: 15 (10/13/15 1500)   Called and spoke with Richard Herring regarding his interest in smoking cessation. Will having lung navigator contact him regarding classes we offer.

## 2015-10-14 ENCOUNTER — Inpatient Hospital Stay: Payer: Medicaid Other | Attending: Oncology | Admitting: Oncology

## 2015-10-14 VITALS — BP 134/76 | HR 70 | Temp 96.9°F | Resp 18 | Wt 172.0 lb

## 2015-10-14 DIAGNOSIS — M1990 Unspecified osteoarthritis, unspecified site: Secondary | ICD-10-CM | POA: Insufficient documentation

## 2015-10-14 DIAGNOSIS — D61818 Other pancytopenia: Secondary | ICD-10-CM | POA: Diagnosis not present

## 2015-10-14 DIAGNOSIS — E876 Hypokalemia: Secondary | ICD-10-CM | POA: Diagnosis not present

## 2015-10-14 DIAGNOSIS — J449 Chronic obstructive pulmonary disease, unspecified: Secondary | ICD-10-CM | POA: Insufficient documentation

## 2015-10-14 DIAGNOSIS — N186 End stage renal disease: Secondary | ICD-10-CM | POA: Insufficient documentation

## 2015-10-14 DIAGNOSIS — R911 Solitary pulmonary nodule: Secondary | ICD-10-CM | POA: Diagnosis not present

## 2015-10-14 DIAGNOSIS — K219 Gastro-esophageal reflux disease without esophagitis: Secondary | ICD-10-CM | POA: Diagnosis not present

## 2015-10-14 DIAGNOSIS — F17210 Nicotine dependence, cigarettes, uncomplicated: Secondary | ICD-10-CM | POA: Diagnosis not present

## 2015-10-14 DIAGNOSIS — Z992 Dependence on renal dialysis: Secondary | ICD-10-CM | POA: Insufficient documentation

## 2015-10-14 DIAGNOSIS — K7460 Unspecified cirrhosis of liver: Secondary | ICD-10-CM | POA: Insufficient documentation

## 2015-10-14 DIAGNOSIS — D696 Thrombocytopenia, unspecified: Secondary | ICD-10-CM | POA: Diagnosis not present

## 2015-10-14 DIAGNOSIS — Z79899 Other long term (current) drug therapy: Secondary | ICD-10-CM | POA: Diagnosis not present

## 2015-10-14 DIAGNOSIS — I129 Hypertensive chronic kidney disease with stage 1 through stage 4 chronic kidney disease, or unspecified chronic kidney disease: Secondary | ICD-10-CM | POA: Diagnosis not present

## 2015-10-14 DIAGNOSIS — I482 Chronic atrial fibrillation: Secondary | ICD-10-CM | POA: Diagnosis not present

## 2015-10-14 DIAGNOSIS — D631 Anemia in chronic kidney disease: Secondary | ICD-10-CM | POA: Diagnosis not present

## 2015-10-14 NOTE — Progress Notes (Signed)
States is having severe abd pain and requests prescription for pain medication.

## 2015-10-14 NOTE — Progress Notes (Signed)
[place]  Telephone:(336) [phone] [phone]) [phone]  ID: Richard Herring OB: May 22, 1955  MR#: [id number]  DTO#:[id number]  Patient Care Team: Murlean Iba, MD as PCP - General (Internal Medicine)  CHIEF COMPLAINT: Left upper lobe pulmonary nodule, thrombocytopenia.  INTERVAL HISTORY: Patient returns to clinic today for further evaluation and discussion of his imaging results. He continues to have nonspecific pain. He also states he is more weak and fatigued. He has no neurologic complaints.  He denies any recent fevers or illnesses.  He denies any easy bleeding or bruising. He has a fair appetite and denies weight loss. He has no chest pain, cough, shortness of breath, or hemoptysis. He denies any nausea, vomiting, constipation, or diarrhea. He has no melena or hematochezia. He has no urinary complaints. Patient offers no further specific complaints today.    REVIEW OF SYSTEMS:   Review of Systems  Constitutional: Positive for malaise/fatigue. Negative for fever and weight loss.  Respiratory: Negative.  Negative for cough, hemoptysis and shortness of breath.   Cardiovascular: Negative.  Negative for chest pain.  Gastrointestinal: Positive for abdominal pain. Negative for blood in stool, constipation, diarrhea, melena, nausea and vomiting.  Genitourinary: Negative.   Musculoskeletal: Positive for back pain.  Neurological: Positive for weakness.  Endo/Heme/Allergies: Does not bruise/bleed easily.  Psychiatric/Behavioral: Negative.  The patient is not nervous/anxious.     As per HPI. Otherwise, a complete review of systems is negatve.  PAST MEDICAL HISTORY: Past Medical History:  Diagnosis Date  . Anemia of chronic disease   . Anxiety   . Arthritis   . Asthma   . Chronic atrial fibrillation (HCC)    a. not on long term full-dose anticoagulaiton 2/2 thrombocytopenia; CHADS2VASc 0 based on available data  . Cirrhosis ([place])   . COPD (chronic obstructive pulmonary  disease) ([place])   . Depression   . ESRD on dialysis ([place])   . GERD (gastroesophageal reflux disease)   . Gout   . Hepatitis C   . Hypertension   . Incarceration   . Renal insufficiency    Pt takes Dialysis Tues, Thursday and Saturday.   . Stomach ulcer   . Thrombocytopenia ([place])     PAST SURGICAL HISTORY: Past Surgical History:  Procedure Laterality Date  . CARDIAC DEFIBRILLATOR PLACEMENT  2015  . DIALYSIS FISTULA CREATION  2014  . PERIPHERAL VASCULAR CATHETERIZATION Left 08/21/2015   Procedure: A/V Shuntogram/Fistulagram;  Surgeon: Katha Cabal, MD;  Location: [facility];  Service: Cardiovascular;  Laterality: Left;  . PERIPHERAL VASCULAR CATHETERIZATION N/A 08/21/2015   Procedure: A/V Shunt Intervention;  Surgeon: Katha Cabal, MD;  Location: [facility];  Service: Cardiovascular;  Laterality: N/A;  . PERITONEAL WOUND DRAINAGE  2015,2016   [place]      FAMILY HISTORY: Family History  Problem Relation Age of Onset  . Kidney cancer Neg Hx   . Prostate cancer Neg Hx        ADVANCED DIRECTIVES:    HEALTH MAINTENANCE: Social History  Substance Use Topics  . Smoking status: Current Every Day Smoker    Packs/day: 1.50    Years: 30.00    Types: Cigarettes  . Smokeless tobacco: Never Used     Comment: pt states he stopped smoking X10 years  . Alcohol use 0.0 oz/week     Comment: drinks beer     Colonoscopy:  PAP:  Bone density:  Lipid panel:  No Known Allergies  Current Outpatient Prescriptions  Medication Sig Dispense Refill  .  albuterol (PROVENTIL HFA;VENTOLIN HFA) 108 (90 Base) MCG/ACT inhaler Inhale 2 puffs into the lungs 4 (four) times daily as needed for wheezing or shortness of breath. 1 Inhaler 0  . aspirin EC 81 MG tablet Take 81 mg by mouth daily. Reported on 08/06/2015    . calcium carbonate (TUMS - DOSED IN MG ELEMENTAL CALCIUM) 500 MG chewable tablet Chew 2 tablets by mouth 4 (four) times daily as needed for indigestion or  heartburn.    . docusate sodium (COLACE) 100 MG capsule Take 1 capsule (100 mg total) by mouth 2 (two) times daily as needed for mild constipation. 20 capsule 0  . furosemide (LASIX) 40 MG tablet Take 1 tablet (40 mg total) by mouth 2 (two) times daily. 60 tablet 0  . hydrocortisone 2.5 % cream Apply 1 application topically 2 (two) times daily as needed. 20 g 0  . metoprolol (LOPRESSOR) 50 MG tablet Take 1 tablet (50 mg total) by mouth 2 (two) times daily. Reported on 08/21/2015 60 tablet 0  . pramoxine (PROCTOFOAM) 1 % foam Place 1 application rectally 3 (three) times daily as needed for itching. 15 g 0  . predniSONE (DELTASONE) 20 MG tablet Take 1 tablet (20 mg total) by mouth daily. 5 tablet 0  . QUEtiapine (SEROQUEL) 100 MG tablet Take 1 tablet (100 mg total) by mouth 2 (two) times daily. 60 tablet 0  . ranitidine (ZANTAC) 300 MG tablet Take 1 tablet (300 mg total) by mouth 2 (two) times daily. 60 tablet 0  . sevelamer carbonate (RENVELA) 800 MG tablet Take 1 tablet (800 mg total) by mouth 3 (three) times daily with meals. 90 tablet 0  . traMADol (ULTRAM) 50 MG tablet Take 50 mg by mouth every 6 (six) hours as needed.    . triamcinolone (NASACORT ALLERGY 24HR) 55 MCG/ACT AERO nasal inhaler Place 1 spray into the nose daily. 1 Inhaler 0  . vitamin B-12 (CYANOCOBALAMIN) 1000 MCG tablet Take 1 tablet (1,000 mcg total) by mouth daily. 90 tablet 0  . vitamin C (ASCORBIC ACID) 500 MG tablet Take 500 mg by mouth daily.     No current facility-administered medications for this visit.     OBJECTIVE: Vitals:   10/14/15 1528  BP: 134/76  Pulse: 70  Resp: 18  Temp: (!) 96.9 F (36.1 C)     Body mass index is 24.67 kg/m.    ECOG FS:0 - Asymptomatic  General: Well-developed, well-nourished, no acute distress. Eyes: Pink conjunctiva, anicteric sclera. Lungs: Clear to auscultation bilaterally. Heart: Regular rate and rhythm. No rubs, murmurs, or gallops. Abdomen: Soft, nontender, nondistended. No  organomegaly noted, normoactive bowel sounds. Musculoskeletal: No edema, cyanosis, or clubbing. Dialysis fistula noted in left upper arm. Neuro: Alert, answering all questions appropriately. Cranial nerves grossly intact. Skin: No rashes or petechiae noted. Psych: Normal affect.  LAB RESULTS:  Lab Results  Component Value Date   NA 139 09/10/2015   K 3.1 (L) 09/10/2015   CL 106 09/10/2015   CO2 18 (L) 09/10/2015   GLUCOSE 117 (H) 09/10/2015   BUN 47 (H) 09/10/2015   CREATININE 7.68 (H) 09/10/2015   CALCIUM 8.6 (L) 09/10/2015   PROT 6.5 08/15/2015   ALBUMIN 3.2 (L) 08/15/2015   AST 75 (H) 08/15/2015   ALT 91 (H) 08/15/2015   ALKPHOS 72 08/15/2015   BILITOT 0.4 08/15/2015   GFRNONAA 7 (L) 09/10/2015   GFRAA 8 (L) 09/10/2015    Lab Results  Component Value Date   WBC 3.4 (L)  09/10/2015   NEUTROABS 1.3 (L) 08/15/2015   HGB 12.8 (L) 09/10/2015   HCT 36.7 (L) 09/10/2015   MCV 99.0 09/10/2015   PLT 47 (L) 09/10/2015     STUDIES: Ct Chest W Contrast  Result Date: 10/07/2015 CLINICAL DATA:  Followup pulmonary nodule. EXAM: CT CHEST WITH CONTRAST TECHNIQUE: Multidetector CT imaging of the chest was performed during intravenous contrast administration. CONTRAST:  67m ISOVUE-300 IOPAMIDOL (ISOVUE-300) INJECTION 61% COMPARISON:  CT 06/30/2015 FINDINGS: Cardiovascular: Coronary artery calcification and aortic atherosclerotic calcification. Mediastinum/Nodes: No axillary or supraclavicular adenopathy. No mediastinal hilar adenopathy. Moderate size hiatal hernia. There is diffuse esophageal wall thickening superior to hernia soft tissue posterior RIGHT into the esophagus (image 121, series 2 this suggesting esophageal varices (). Lungs/Pleura: Within the LEFT upper lobe spiculated nodule measuring 17 mm x17 mm compares to 16 mm by 14 mm. Lesion measures 21 mm in craniocaudad dimension compared to 19 mm. No additional pulmonary nodularity. Upper Abdomen: Nodular liver with ascites  consistent with cirrhosis. Liver is spleen is enlarged. Multiple gallstones noted. Musculoskeletal: No aggressive osseous lesion IMPRESSION: 1. Persistent LEFT upper low spiculated nodule highly concerning bronchogenic carcinoma. Recommend FDG PET scan for staging of this presumed carcinoma. 2. 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 Cannot exclude esophageal neoplasm. This could also be assessed with FDG PET scan potentially. 3. Morphologic changes in the upper abdomen consistent with cirrhosis and portal hypertension. Electronically Signed   By: SSuzy BouchardM.D.   On: 10/07/2015 14:07    ASSESSMENT: Left upper lobe pulmonary nodule, thrombocytopenia.  PLAN:    1. Left upper lobe pulmonary nodule: CT scan results reviewed independently and reported as above with slight progression of his suspicious left upper lobe pulmonary nodule. Will get a PET scan to further evaluate. Return to clinic 1-2 days after his PET scan for further evaluation. Patient will also have consultation with radiation oncology that day. Given his significant thrombocytopenia, he is likely not a surgical candidate.  2. Pancytopenia: Mild. All of patient's other lab work is either negative or within normal limits. Likely multifactorial including dialysis, cirrhosis and splenomegaly.  Patient does not require a bone marrow biopsy at this time.  No intervention is needed. 3. Thrombocytopenia: Decreased but stable. Secondary to cirrhosis, splenomegaly, and positive platelet antibodies. Although these antibodies can be transient in nature. No further intervention is needed at this time. Return to clinic as above. 4.  Renal failure: Continue dialysis as indicated. 5.  Hypokalemia: Replacement as needed with dialysis.  6. Anemia: Case discussed with nephrology and will hold Procrit at this time.  Patient expressed understanding and was in agreement with this plan. He also understands that  He can call clinic at any time with any questions, concerns, or complaints.    TLloyd Huger MD   10/17/2015 9:44 AM

## 2015-10-16 NOTE — Telephone Encounter (Signed)
Will follow up and provide smoking cessation program information next week with patient in clinic.

## 2015-10-20 ENCOUNTER — Institutional Professional Consult (permissible substitution): Payer: Medicaid Other | Admitting: Internal Medicine

## 2015-10-21 ENCOUNTER — Encounter: Admission: RE | Admit: 2015-10-21 | Payer: Medicaid Other | Source: Ambulatory Visit

## 2015-10-21 NOTE — Progress Notes (Deleted)
[facility]  Telephone:(336) [phone] Fax:(336) [phone]  ID: Richard Herring OB: 1955-06-23  MR#: [id number]  WLN#:[id number]  Patient Care Team: Murlean Iba, MD as PCP - General (Internal Medicine)  CHIEF COMPLAINT: Left upper lobe pulmonary nodule, thrombocytopenia.  INTERVAL HISTORY: Patient returns to clinic today for further evaluation and discussion of his imaging results. He continues to have nonspecific pain. He also states he is more weak and fatigued. He has no neurologic complaints.  He denies any recent fevers or illnesses.  He denies any easy bleeding or bruising. He has a fair appetite and denies weight loss. He has no chest pain, cough, shortness of breath, or hemoptysis. He denies any nausea, vomiting, constipation, or diarrhea. He has no melena or hematochezia. He has no urinary complaints. Patient offers no further specific complaints today.    REVIEW OF SYSTEMS:   Review of Systems  Constitutional: Positive for malaise/fatigue. Negative for fever and weight loss.  Respiratory: Negative.  Negative for cough, hemoptysis and shortness of breath.   Cardiovascular: Negative.  Negative for chest pain.  Gastrointestinal: Positive for abdominal pain. Negative for blood in stool, constipation, diarrhea, melena, nausea and vomiting.  Genitourinary: Negative.   Musculoskeletal: Positive for back pain.  Neurological: Positive for weakness.  Endo/Heme/Allergies: Does not bruise/bleed easily.  Psychiatric/Behavioral: Negative.  The patient is not nervous/anxious.     As per HPI. Otherwise, a complete review of systems is negatve.  PAST MEDICAL HISTORY: Past Medical History:  Diagnosis Date  . Anemia of chronic disease   . Anxiety   . Arthritis   . Asthma   . Chronic atrial fibrillation (HCC)    a. not on long term full-dose anticoagulaiton 2/2 thrombocytopenia; CHADS2VASc 0 based on available data  . Cirrhosis ([place])   . COPD (chronic obstructive pulmonary  disease) ([place])   . Depression   . ESRD on dialysis ([place])   . GERD (gastroesophageal reflux disease)   . Gout   . Hepatitis C   . Hypertension   . Incarceration   . Renal insufficiency    Pt takes Dialysis Tues, Thursday and Saturday.   . Stomach ulcer   . Thrombocytopenia ([place])     PAST SURGICAL HISTORY: Past Surgical History:  Procedure Laterality Date  . CARDIAC DEFIBRILLATOR PLACEMENT  2015  . DIALYSIS FISTULA CREATION  2014  . PERIPHERAL VASCULAR CATHETERIZATION Left 08/21/2015   Procedure: A/V Shuntogram/Fistulagram;  Surgeon: Katha Cabal, MD;  Location: [facility];  Service: Cardiovascular;  Laterality: Left;  . PERIPHERAL VASCULAR CATHETERIZATION N/A 08/21/2015   Procedure: A/V Shunt Intervention;  Surgeon: Katha Cabal, MD;  Location: [facility];  Service: Cardiovascular;  Laterality: N/A;  . PERITONEAL WOUND DRAINAGE  2015,2016   [place]      FAMILY HISTORY: Family History  Problem Relation Age of Onset  . Kidney cancer Neg Hx   . Prostate cancer Neg Hx        ADVANCED DIRECTIVES:    HEALTH MAINTENANCE: Social History  Substance Use Topics  . Smoking status: Current Every Day Smoker    Packs/day: 1.50    Years: 30.00    Types: Cigarettes  . Smokeless tobacco: Never Used     Comment: pt states he stopped smoking X10 years  . Alcohol use 0.0 oz/week     Comment: drinks beer     Colonoscopy:  PAP:  Bone density:  Lipid panel:  No Known Allergies  Current Outpatient Prescriptions  Medication Sig Dispense Refill  .  albuterol (PROVENTIL HFA;VENTOLIN HFA) 108 (90 Base) MCG/ACT inhaler Inhale 2 puffs into the lungs 4 (four) times daily as needed for wheezing or shortness of breath. 1 Inhaler 0  . aspirin EC 81 MG tablet Take 81 mg by mouth daily. Reported on 08/06/2015    . calcium carbonate (TUMS - DOSED IN MG ELEMENTAL CALCIUM) 500 MG chewable tablet Chew 2 tablets by mouth 4 (four) times daily as needed for indigestion or  heartburn.    . docusate sodium (COLACE) 100 MG capsule Take 1 capsule (100 mg total) by mouth 2 (two) times daily as needed for mild constipation. 20 capsule 0  . furosemide (LASIX) 40 MG tablet Take 1 tablet (40 mg total) by mouth 2 (two) times daily. 60 tablet 0  . hydrocortisone 2.5 % cream Apply 1 application topically 2 (two) times daily as needed. 20 g 0  . metoprolol (LOPRESSOR) 50 MG tablet Take 1 tablet (50 mg total) by mouth 2 (two) times daily. Reported on 08/21/2015 60 tablet 0  . pramoxine (PROCTOFOAM) 1 % foam Place 1 application rectally 3 (three) times daily as needed for itching. 15 g 0  . predniSONE (DELTASONE) 20 MG tablet Take 1 tablet (20 mg total) by mouth daily. 5 tablet 0  . QUEtiapine (SEROQUEL) 100 MG tablet Take 1 tablet (100 mg total) by mouth 2 (two) times daily. 60 tablet 0  . ranitidine (ZANTAC) 300 MG tablet Take 1 tablet (300 mg total) by mouth 2 (two) times daily. 60 tablet 0  . sevelamer carbonate (RENVELA) 800 MG tablet Take 1 tablet (800 mg total) by mouth 3 (three) times daily with meals. 90 tablet 0  . traMADol (ULTRAM) 50 MG tablet Take 50 mg by mouth every 6 (six) hours as needed.    . triamcinolone (NASACORT ALLERGY 24HR) 55 MCG/ACT AERO nasal inhaler Place 1 spray into the nose daily. 1 Inhaler 0  . vitamin B-12 (CYANOCOBALAMIN) 1000 MCG tablet Take 1 tablet (1,000 mcg total) by mouth daily. 90 tablet 0  . vitamin C (ASCORBIC ACID) 500 MG tablet Take 500 mg by mouth daily.     No current facility-administered medications for this visit.     OBJECTIVE: There were no vitals filed for this visit.   There is no height or weight on file to calculate BMI.    ECOG FS:0 - Asymptomatic  General: Well-developed, well-nourished, no acute distress. Eyes: Pink conjunctiva, anicteric sclera. Lungs: Clear to auscultation bilaterally. Heart: Regular rate and rhythm. No rubs, murmurs, or gallops. Abdomen: Soft, nontender, nondistended. No organomegaly noted,  normoactive bowel sounds. Musculoskeletal: No edema, cyanosis, or clubbing. Dialysis fistula noted in left upper arm. Neuro: Alert, answering all questions appropriately. Cranial nerves grossly intact. Skin: No rashes or petechiae noted. Psych: Normal affect.  LAB RESULTS:  Lab Results  Component Value Date   NA 139 09/10/2015   K 3.1 (L) 09/10/2015   CL 106 09/10/2015   CO2 18 (L) 09/10/2015   GLUCOSE 117 (H) 09/10/2015   BUN 47 (H) 09/10/2015   CREATININE 7.68 (H) 09/10/2015   CALCIUM 8.6 (L) 09/10/2015   PROT 6.5 08/15/2015   ALBUMIN 3.2 (L) 08/15/2015   AST 75 (H) 08/15/2015   ALT 91 (H) 08/15/2015   ALKPHOS 72 08/15/2015   BILITOT 0.4 08/15/2015   GFRNONAA 7 (L) 09/10/2015   GFRAA 8 (L) 09/10/2015    Lab Results  Component Value Date   WBC 3.4 (L) 09/10/2015   NEUTROABS 1.3 (L) 08/15/2015   HGB  12.8 (L) 09/10/2015   HCT 36.7 (L) 09/10/2015   MCV 99.0 09/10/2015   PLT 47 (L) 09/10/2015     STUDIES: Ct Chest W Contrast  Result Date: 10/07/2015 CLINICAL DATA:  Followup pulmonary nodule. EXAM: CT CHEST WITH CONTRAST TECHNIQUE: Multidetector CT imaging of the chest was performed during intravenous contrast administration. CONTRAST:  68m ISOVUE-300 IOPAMIDOL (ISOVUE-300) INJECTION 61% COMPARISON:  CT 06/30/2015 FINDINGS: Cardiovascular: Coronary artery calcification and aortic atherosclerotic calcification. Mediastinum/Nodes: No axillary or supraclavicular adenopathy. No mediastinal hilar adenopathy. Moderate size hiatal hernia. There is diffuse esophageal wall thickening superior to hernia soft tissue posterior RIGHT into the esophagus (image 121, series 2 this suggesting esophageal varices (). Lungs/Pleura: Within the LEFT upper lobe spiculated nodule measuring 17 mm x17 mm compares to 16 mm by 14 mm. Lesion measures 21 mm in craniocaudad dimension compared to 19 mm. No additional pulmonary nodularity. Upper Abdomen: Nodular liver with ascites consistent with cirrhosis.  Liver is spleen is enlarged. Multiple gallstones noted. Musculoskeletal: No aggressive osseous lesion IMPRESSION: 1. Persistent LEFT upper low spiculated nodule highly concerning bronchogenic carcinoma. Recommend FDG PET scan for staging of this presumed carcinoma. 2. 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 Cannot exclude esophageal neoplasm. This could also be assessed with FDG PET scan potentially. 3. Morphologic changes in the upper abdomen consistent with cirrhosis and portal hypertension. Electronically Signed   By: SSuzy BouchardM.D.   On: 10/07/2015 14:07    ASSESSMENT: Left upper lobe pulmonary nodule, thrombocytopenia.  PLAN:    1. Left upper lobe pulmonary nodule: CT scan results reviewed independently and reported as above with slight progression of his suspicious left upper lobe pulmonary nodule. Will get a PET scan to further evaluate. Return to clinic 1-2 days after his PET scan for further evaluation. Patient will also have consultation with radiation oncology that day. Given his significant thrombocytopenia, he is likely not a surgical candidate.  2. Pancytopenia: Mild. All of patient's other lab work is either negative or within normal limits. Likely multifactorial including dialysis, cirrhosis and splenomegaly.  Patient does not require a bone marrow biopsy at this time.  No intervention is needed. 3. Thrombocytopenia: Decreased but stable. Secondary to cirrhosis, splenomegaly, and positive platelet antibodies. Although these antibodies can be transient in nature. No further intervention is needed at this time. Return to clinic as above. 4.  Renal failure: Continue dialysis as indicated. 5.  Hypokalemia: Replacement as needed with dialysis.  6. Anemia: Case discussed with nephrology and will hold Procrit at this time.  Patient expressed understanding and was in agreement with this plan. He also understands that He can call clinic at any  time with any questions, concerns, or complaints.    TLloyd Huger MD   10/21/2015 11:19 PM

## 2015-10-22 ENCOUNTER — Ambulatory Visit: Payer: Medicaid Other | Admitting: Radiation Oncology

## 2015-10-22 ENCOUNTER — Inpatient Hospital Stay: Payer: Medicaid Other | Admitting: Oncology

## 2015-10-23 ENCOUNTER — Ambulatory Visit: Payer: Self-pay | Admitting: Gastroenterology

## 2015-10-27 ENCOUNTER — Ambulatory Visit
Admission: RE | Admit: 2015-10-27 | Discharge: 2015-10-27 | Disposition: A | Discharge status: Home / Self Care | Payer: Medicaid Other | Source: Ambulatory Visit | Attending: Oncology | Admitting: Oncology

## 2015-10-27 DIAGNOSIS — R935 Abnormal findings on diagnostic imaging of other abdominal regions, including retroperitoneum: Secondary | ICD-10-CM | POA: Insufficient documentation

## 2015-10-27 DIAGNOSIS — K766 Portal hypertension: Secondary | ICD-10-CM | POA: Insufficient documentation

## 2015-10-27 DIAGNOSIS — K7460 Unspecified cirrhosis of liver: Secondary | ICD-10-CM | POA: Insufficient documentation

## 2015-10-27 DIAGNOSIS — R911 Solitary pulmonary nodule: Secondary | ICD-10-CM | POA: Insufficient documentation

## 2015-10-27 LAB — GLUCOSE, CAPILLARY: Glucose-Capillary: 92 mg/dL (ref 65–99)

## 2015-10-27 MED ORDER — FLUDEOXYGLUCOSE F - 18 (FDG) INJECTION
13.2100 | Freq: Once | INTRAVENOUS | Status: AC | PRN
  Administered 2015-10-27: 13.21 via INTRAVENOUS

## 2015-10-28 ENCOUNTER — Inpatient Hospital Stay: Payer: Medicaid Other | Admitting: Oncology

## 2015-10-28 ENCOUNTER — Ambulatory Visit
Admission: RE | Admit: 2015-10-28 | Discharge: 2015-10-28 | Disposition: A | Discharge status: Home / Self Care | Payer: Medicaid Other | Source: Ambulatory Visit | Attending: Radiation Oncology | Admitting: Radiation Oncology

## 2015-10-28 ENCOUNTER — Encounter: Payer: Self-pay | Admitting: Radiation Oncology

## 2015-10-28 VITALS — BP 122/75 | HR 70 | Temp 98.0°F | Resp 20 | Wt 174.5 lb

## 2015-10-28 DIAGNOSIS — D61818 Other pancytopenia: Secondary | ICD-10-CM | POA: Diagnosis not present

## 2015-10-28 DIAGNOSIS — K7460 Unspecified cirrhosis of liver: Secondary | ICD-10-CM | POA: Insufficient documentation

## 2015-10-28 DIAGNOSIS — J449 Chronic obstructive pulmonary disease, unspecified: Secondary | ICD-10-CM | POA: Diagnosis not present

## 2015-10-28 DIAGNOSIS — N186 End stage renal disease: Secondary | ICD-10-CM | POA: Diagnosis not present

## 2015-10-28 DIAGNOSIS — B1920 Unspecified viral hepatitis C without hepatic coma: Secondary | ICD-10-CM | POA: Diagnosis not present

## 2015-10-28 DIAGNOSIS — C3412 Malignant neoplasm of upper lobe, left bronchus or lung: Secondary | ICD-10-CM | POA: Diagnosis not present

## 2015-10-28 DIAGNOSIS — R911 Solitary pulmonary nodule: Secondary | ICD-10-CM

## 2015-10-28 DIAGNOSIS — Z510 Encounter for antineoplastic radiation therapy: Secondary | ICD-10-CM | POA: Diagnosis present

## 2015-10-28 NOTE — Consult Note (Signed)
Except an outstanding is perfect of Radiation Oncology NEW PATIENT EVALUATION  Name: Richard Herring  MRN: [id number]  Date:   10/28/2015     DOB: 12/22/1955   This 60 y.o. male patient presents to the clinic for initial evaluation of left upper lobe solitary lung nodule consistent with primary bronchogenic carcinoma.  REFERRING PHYSICIAN: Murlean Iba, MD  CHIEF COMPLAINT:  Chief Complaint  Patient presents with  . Lung Cancer    Pt is here for initial consultation of lung cancer.      DIAGNOSIS: The encounter diagnosis was Pulmonary nodule.   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 This was confirmed on PET CT scan performed this week showing hypermetabolic activity in the left upper lobe lesion consistent with primary bronchogenic lung cancer. No other evidence of mediastinal adenopathy or other lesions were identified. He has been seen by pulmonologist during his hospital stay recently although no tissue has been attempted at this time. I have discussed the case with medical oncology and possible CT-guided fine-needle aspiration will be attempted after platelet transfusion. Patient is seen today feels poorly. He states he has "aches and pains all over his body". He also complains of stomach pain although he is noted to have gastric ulcer disease. Patient specifically denies hemoptysis chest tightness or significant dyspnea on exertion.  PLANNED TREATMENT REGIMEN: SB RT  PAST MEDICAL HISTORY:  has a past medical history of Anemia of chronic disease; Anxiety; Arthritis; Asthma; Chronic atrial fibrillation ([place]); Cirrhosis ([place]); COPD (chronic obstructive pulmonary disease) ([place]);  Depression; ESRD on dialysis [facility]); GERD (gastroesophageal reflux disease); Gout; Hepatitis C; Hypertension; Incarceration; Renal insufficiency; Stomach ulcer; and Thrombocytopenia ([place]).    PAST SURGICAL HISTORY:  Past Surgical History:  Procedure Laterality Date  . CARDIAC DEFIBRILLATOR PLACEMENT  2015  . DIALYSIS FISTULA CREATION  2014  . PERIPHERAL VASCULAR CATHETERIZATION Left 08/21/2015   Procedure: A/V Shuntogram/Fistulagram;  Surgeon: Katha Cabal, MD;  Location: [facility];  Service: Cardiovascular;  Laterality: Left;  . PERIPHERAL VASCULAR CATHETERIZATION N/A 08/21/2015   Procedure: A/V Shunt Intervention;  Surgeon: Katha Cabal, MD;  Location: [facility];  Service: Cardiovascular;  Laterality: N/A;  . PERITONEAL WOUND DRAINAGE  2015,2016   gangreen      FAMILY HISTORY: family history is not on file.  SOCIAL HISTORY:  reports that he has been smoking Cigarettes.  He has a 45.00 pack-year smoking history. He has never used smokeless tobacco. He reports that he drinks alcohol. He reports that he does not use drugs.  ALLERGIES: Review of patient's allergies indicates no known allergies.  MEDICATIONS:  Current Outpatient Prescriptions  Medication Sig Dispense Refill  . aspirin EC 81 MG tablet Take 81 mg by mouth daily. Reported on 08/06/2015    . docusate sodium (COLACE) 100 MG capsule Take 1 capsule (100 mg total) by mouth 2 (two) times daily as needed for mild constipation. 20 capsule 0  . folic acid (FOLVITE) 1 MG tablet Take 1 mg by mouth.    . furosemide (LASIX) 40 MG tablet Take 1 tablet (40 mg total) by mouth 2 (two) times daily. 60 tablet 0  . hydrocortisone 2.5 % cream Apply 1 application topically 2 (two) times  daily as needed. 20 g 0  . metoprolol (LOPRESSOR) 50 MG tablet Take 1 tablet (50 mg total) by mouth 2 (two) times daily. Reported on 08/21/2015 60 tablet 0  . pramoxine (PROCTOFOAM) 1 % foam Place 1 application rectally 3 (three) times  daily as needed for itching. 15 g 0  . QUEtiapine (SEROQUEL) 100 MG tablet Take 1 tablet (100 mg total) by mouth 2 (two) times daily. 60 tablet 0  . ranitidine (ZANTAC) 300 MG tablet Take 1 tablet (300 mg total) by mouth 2 (two) times daily. 60 tablet 0  . sevelamer carbonate (RENVELA) 800 MG tablet Take 1 tablet (800 mg total) by mouth 3 (three) times daily with meals. 90 tablet 0  . triamcinolone (NASACORT ALLERGY 24HR) 55 MCG/ACT AERO nasal inhaler Place 1 spray into the nose daily. 1 Inhaler 0  . vitamin B-12 (CYANOCOBALAMIN) 1000 MCG tablet Take 1 tablet (1,000 mcg total) by mouth daily. 90 tablet 0  . vitamin C (ASCORBIC ACID) 500 MG tablet Take 500 mg by mouth daily.    Marland Kitchen albuterol (PROVENTIL HFA;VENTOLIN HFA) 108 (90 Base) MCG/ACT inhaler Inhale 2 puffs into the lungs 4 (four) times daily as needed for wheezing or shortness of breath. 1 Inhaler 0  . calcium carbonate (TUMS - DOSED IN MG ELEMENTAL CALCIUM) 500 MG chewable tablet Chew 2 tablets by mouth 4 (four) times daily as needed for indigestion or heartburn.    . predniSONE (DELTASONE) 20 MG tablet Take 1 tablet (20 mg total) by mouth daily. 5 tablet 0  . traMADol (ULTRAM) 50 MG tablet Take 50 mg by mouth every 6 (six) hours as needed.     No current facility-administered medications for this encounter.     ECOG PERFORMANCE STATUS:  0 - Asymptomatic  REVIEW OF SYSTEMS: Patient had mild has multiple complains fatigue malaise associated with his renal dialysis and pancytopenia.  Patient denies any weight loss, fatigue, weakness, fever, chills or night sweats. Patient denies any loss of vision, blurred vision. Patient denies any ringing  of the ears or hearing loss. No irregular heartbeat. Patient denies heart murmur or history of fainting. Patient denies any chest pain or pain radiating to her upper extremities. Patient denies any shortness of breath, difficulty breathing at night, cough or hemoptysis. Patient denies any swelling in the  lower legs. Patient denies any nausea vomiting, vomiting of blood, or coffee ground material in the vomitus. Patient denies any stomach pain. Patient states has had normal bowel movements no significant constipation or diarrhea. Patient denies any dysuria, hematuria or significant nocturia. Patient denies any problems walking, swelling in the joints or loss of balance. Patient denies any skin changes, loss of hair or loss of weight. Patient denies any excessive worrying or anxiety or significant depression. Patient denies any problems with insomnia. Patient denies excessive thirst, polyuria, polydipsia. Patient denies any swollen glands, patient denies easy bruising or easy bleeding. Patient denies any recent infections, allergies or URI. Patient "s visual fields have not changed significantly in recent time.    PHYSICAL EXAM: BP 122/75   Pulse 70   Temp 98 F (36.7 C)   Resp 20   Wt 174 lb 7.9 oz (79.2 kg)   BMI 25.04 kg/m  A well-developed male in NAD. No cervical or supraclavicular adenopathy is identified. Patient does have obvious ascites of his abdomen. Well-developed well-nourished patient in NAD. HEENT reveals PERLA, EOMI, discs not visualized.  Oral cavity is clear. No oral mucosal lesions are identified. Neck is  clear without evidence of cervical or supraclavicular adenopathy. Lungs are clear to A&P. Cardiac examination is essentially unremarkable with regular rate and rhythm without murmur rub or thrill. Abdomen is benign with no organomegaly or masses noted. Motor sensory and DTR levels are equal and symmetric in the upper and lower extremities. Cranial nerves II through XII are grossly intact. Proprioception is intact. No peripheral adenopathy or edema is identified. No motor or sensory levels are noted. Crude visual fields are within normal range.  LABORATORY DATA: CT-guided fine-needle aspiration will be ordered    RADIOLOGY RESULTS: PET CT and CT scans reviewed compatible with the  above-stated findings   IMPRESSION: Solitary stage I primary bronchogenic lung cancer of the left upper lobe in 60 year old male with multiple medical comorbidities.  PLAN: At this time I believe were dealing with a primary solitary left upper lobe bronchogenic carcinoma. We will attempt CT-guided biopsy after platelet transfusion if this can be arranged with radiology. This will be ordered by medical oncology. The patient early next week. Risks and benefits of SB RT including possible development of cough, fatigue, alteration of blood counts and slight chance of radiation esophagitis all were discussed in detail with the patient. He seems to comprehend my treatment plan well. I will see him back in 2 weeks in follow-up to check on status of his possible biopsy and if possible biopsy is impossible to do based on medical comorbidities and pancytopenia will go ahead with SB RT 5000 cGy in 5 fractions.  I would like to take this opportunity to thank you for allowing me to participate in the care of your patient.Armstead Peaks., MD

## 2015-10-29 ENCOUNTER — Ambulatory Visit: Payer: Medicaid Other | Admitting: Oncology

## 2015-11-01 NOTE — Progress Notes (Signed)
Richard Herring  [phone]) [phone] Fax:(336) [phone]  ID: Richard Herring OB: 1955-07-27  MR#: [id number]  OVZ#:[id number]  Patient Care Team: Murlean Iba, MD as PCP - General (Internal Medicine)  CHIEF COMPLAINT: Left upper lobe pulmonary nodule, thrombocytopenia.  INTERVAL HISTORY: Patient returns to clinic today for further evaluation and discussion of his PET scan results. He continues to have nonspecific pain "all over". He also states he is more weak and fatigued. He has no neurologic complaints.  He denies any recent fevers or illnesses.  He denies any easy bleeding or bruising. He has a fair appetite and denies weight loss. He has no chest pain, cough, shortness of breath, or hemoptysis. He denies any nausea, vomiting, constipation, or diarrhea. He has no melena or hematochezia. He has no urinary complaints. Patient offers no further specific complaints today.    REVIEW OF SYSTEMS:   Review of Systems  Constitutional: Positive for malaise/fatigue. Negative for fever and weight loss.  Respiratory: Negative.  Negative for cough, hemoptysis and shortness of breath.   Cardiovascular: Negative.  Negative for chest pain.  Gastrointestinal: Positive for abdominal pain. Negative for blood in stool, constipation, diarrhea, melena, nausea and vomiting.  Genitourinary: Negative.   Musculoskeletal: Positive for back pain.  Neurological: Positive for weakness.  Endo/Heme/Allergies: Does not bruise/bleed easily.  Psychiatric/Behavioral: Negative.  The patient is not nervous/anxious.     As per HPI. Otherwise, a complete review of systems is negatve.  PAST MEDICAL HISTORY: Past Medical History:  Diagnosis Date  . Anemia of chronic disease   . Anxiety   . Arthritis   . Asthma   . Chronic atrial fibrillation ([facility])    a. not on long term full-dose anticoagulaiton 2/2 thrombocytopenia; CHADS2VASc 0 based on available data  . Cirrhosis ([place])   . COPD (chronic  obstructive pulmonary disease) ([place])   . Depression   . ESRD on dialysis ([place])   . GERD (gastroesophageal reflux disease)   . Gout   . Hepatitis C   . Hypertension   . Incarceration   . Renal insufficiency    Pt takes Dialysis Tues, Thursday and Saturday.   . Stomach ulcer   . Thrombocytopenia ([place])     PAST SURGICAL HISTORY: Past Surgical History:  Procedure Laterality Date  . CARDIAC DEFIBRILLATOR PLACEMENT  2015  . DIALYSIS FISTULA CREATION  2014  . PERIPHERAL VASCULAR CATHETERIZATION Left 08/21/2015   Procedure: A/V Shuntogram/Fistulagram;  Surgeon: Katha Cabal, MD;  Location: [facility];  Service: Cardiovascular;  Laterality: Left;  . PERIPHERAL VASCULAR CATHETERIZATION N/A 08/21/2015   Procedure: A/V Shunt Intervention;  Surgeon: Katha Cabal, MD;  Location: [facility];  Service: Cardiovascular;  Laterality: N/A;  . PERITONEAL WOUND DRAINAGE  2015,2016   gangreen      FAMILY HISTORY: Family History  Problem Relation Age of Onset  . Kidney cancer Neg Hx   . Prostate cancer Neg Hx        ADVANCED DIRECTIVES:    HEALTH MAINTENANCE: Social History  Substance Use Topics  . Smoking status: Current Every Day Smoker    Packs/day: 1.50    Years: 30.00    Types: Cigarettes  . Smokeless tobacco: Never Used     Comment: pt states he stopped smoking X10 years  . Alcohol use 0.0 oz/week     Comment: drinks beer     Colonoscopy:  PAP:  Bone density:  Lipid panel:  No Known Allergies  Current Outpatient Prescriptions  Medication  Sig Dispense Refill  . albuterol (PROVENTIL HFA;VENTOLIN HFA) 108 (90 Base) MCG/ACT inhaler Inhale 2 puffs into the lungs 4 (four) times daily as needed for wheezing or shortness of breath. 1 Inhaler 0  . aspirin EC 81 MG tablet Take 81 mg by mouth daily. Reported on 08/06/2015    . buPROPion (WELLBUTRIN SR) 150 MG 12 hr tablet Take 150 mg by mouth daily.    . calcium carbonate (TUMS - DOSED IN MG ELEMENTAL  CALCIUM) 500 MG chewable tablet Chew 2 tablets by mouth 4 (four) times daily as needed for indigestion or heartburn.    . docusate sodium (COLACE) 100 MG capsule Take 1 capsule (100 mg total) by mouth 2 (two) times daily as needed for mild constipation. 20 capsule 0  . folic acid (FOLVITE) 1 MG tablet Take 1 mg by mouth.    . furosemide (LASIX) 40 MG tablet Take 1 tablet (40 mg total) by mouth 2 (two) times daily. 60 tablet 0  . hydrocortisone 2.5 % cream Apply 1 application topically 2 (two) times daily.    . hydrOXYzine (ATARAX/VISTARIL) 25 MG tablet Take 25 mg by mouth every 6 (six) hours as needed for anxiety.    Marland Kitchen LORazepam (ATIVAN) 0.5 MG tablet Take 0.5 mg by mouth 2 (two) times daily as needed for anxiety.    . metoprolol (LOPRESSOR) 50 MG tablet Take 1 tablet (50 mg total) by mouth 2 (two) times daily. Reported on 08/21/2015 60 tablet 0  . ondansetron (ZOFRAN) 4 MG tablet Take 4 mg by mouth every 8 (eight) hours as needed for nausea or vomiting.    . pramoxine (PROCTOFOAM) 1 % foam Place 1 application rectally 3 (three) times daily as needed for itching. 15 g 0  . pramoxine-hydrocortisone 1-1 % foam Apply 1 applicator topically 3 (three) times daily.    . QUEtiapine (SEROQUEL) 100 MG tablet Take 1 tablet (100 mg total) by mouth 2 (two) times daily. 60 tablet 0  . ranitidine (ZANTAC) 300 MG tablet Take 1 tablet (300 mg total) by mouth 2 (two) times daily. 60 tablet 0  . sevelamer carbonate (RENVELA) 800 MG tablet Take 1 tablet (800 mg total) by mouth 3 (three) times daily with meals. 90 tablet 0  . triamcinolone (NASACORT ALLERGY 24HR) 55 MCG/ACT AERO nasal inhaler Place 1 spray into the nose daily. 1 Inhaler 0  . vitamin B-12 (CYANOCOBALAMIN) 1000 MCG tablet Take 1,000 mcg by mouth daily.    . vitamin C (ASCORBIC ACID) 500 MG tablet Take 500 mg by mouth daily.     No current facility-administered medications for this visit.     OBJECTIVE: Vitals:   11/02/15 0928  BP: 124/71  Pulse:  63  Resp: 18  Temp: 97 F (36.1 C)     Body mass index is 25.42 kg/m.    ECOG FS:0 - Asymptomatic  General: Well-developed, well-nourished, no acute distress. Eyes: Pink conjunctiva, anicteric sclera. Lungs: Clear to auscultation bilaterally. Heart: Regular rate and rhythm. No rubs, murmurs, or gallops. Abdomen: Soft, nontender, nondistended. No organomegaly noted, normoactive bowel sounds. Musculoskeletal: No edema, cyanosis, or clubbing. Dialysis Catheter in right chest wall, surgery to repair fistula and upper left arm. Neuro: Alert, answering all questions appropriately. Cranial nerves grossly intact. Skin: No rashes or petechiae noted. Psych: Normal affect.  LAB RESULTS:  Lab Results  Component Value Date   NA 139 09/10/2015   K 3.1 (L) 09/10/2015   CL 106 09/10/2015   CO2 18 (L) 09/10/2015  GLUCOSE 117 (H) 09/10/2015   BUN 47 (H) 09/10/2015   CREATININE 7.68 (H) 09/10/2015   CALCIUM 8.6 (L) 09/10/2015   PROT 6.5 08/15/2015   ALBUMIN 3.2 (L) 08/15/2015   AST 75 (H) 08/15/2015   ALT 91 (H) 08/15/2015   ALKPHOS 72 08/15/2015   BILITOT 0.4 08/15/2015   GFRNONAA 7 (L) 09/10/2015   GFRAA 8 (L) 09/10/2015    Lab Results  Component Value Date   WBC 3.4 (L) 09/10/2015   NEUTROABS 1.3 (L) 08/15/2015   HGB 12.8 (L) 09/10/2015   HCT 36.7 (L) 09/10/2015   MCV 99.0 09/10/2015   PLT 47 (L) 09/10/2015     STUDIES: Ct Chest W Contrast  Result Date: 10/07/2015 CLINICAL DATA:  Followup pulmonary nodule. EXAM: CT CHEST WITH CONTRAST TECHNIQUE: Multidetector CT imaging of the chest was performed during intravenous contrast administration. CONTRAST:  26m ISOVUE-300 IOPAMIDOL (ISOVUE-300) INJECTION 61% COMPARISON:  CT 06/30/2015 FINDINGS: Cardiovascular: Coronary artery calcification and aortic atherosclerotic calcification. Mediastinum/Nodes: No axillary or supraclavicular adenopathy. No mediastinal hilar adenopathy. Moderate size hiatal hernia. There is diffuse esophageal wall  thickening superior to hernia soft tissue posterior RIGHT into the esophagus (image 121, series 2 this suggesting esophageal varices (). Lungs/Pleura: Within the LEFT upper lobe spiculated nodule measuring 17 mm x17 mm compares to 16 mm by 14 mm. Lesion measures 21 mm in craniocaudad dimension compared to 19 mm. No additional pulmonary nodularity. Upper Abdomen: Nodular liver with ascites consistent with cirrhosis. Liver is spleen is enlarged. Multiple gallstones noted. Musculoskeletal: No aggressive osseous lesion IMPRESSION: 1. Persistent LEFT upper low spiculated nodule highly concerning bronchogenic carcinoma. Recommend FDG PET scan for staging of this presumed carcinoma. 2. 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 Cannot exclude esophageal neoplasm. This could also be assessed with FDG PET scan potentially. 3. Morphologic changes in the upper abdomen consistent with cirrhosis and portal hypertension. Electronically Signed   By: SSuzy BouchardM.D.   On: 10/07/2015 14:07   Nm Pet Image Initial (pi) Skull Base To Thigh  Result Date: 10/27/2015 CLINICAL DATA:  Initial treatment strategy for solitary pulmonary nodule. EXAM: NUCLEAR MEDICINE PET SKULL BASE TO THIGH TECHNIQUE: 13.2 mCi F-18 FDG was injected intravenously. Full-ring PET imaging was performed from the skull base to thigh after the radiotracer. CT data was obtained and used for attenuation correction and anatomic localization. FASTING BLOOD GLUCOSE:  Value: 92 mg/dl COMPARISON:  CT chest 10/07/2015 FINDINGS: NECK No hypermetabolic lymph nodes in the neck. There is focus of metabolic activity at the junction of the hard palate and soft palate on the RIGHT with SUV max equal 2.6 which is relatively mild activity. Mild asymmetric activity in the RIGHT tonsil/ base of tongue with SUV max equaled 2.5. No CT abnormality to correlate with these metabolic foci. CHEST Intense metabolic activity associated with LEFT  upper lobe pulmonary nodule measuring 18 mm with SUV max equal 4.8. No additional hypermetabolic nodules. No hypermetabolic mediastinal lymph nodes. Diffuse esophageal thickening again noted. There is a focus of metabolic activity associated with the anterior wall of the distal esophagus approximately 2 cm above the GE junction with SUV max equal 3.2 ABDOMEN/PELVIS No abnormal hypermetabolic activity within the liver, pancreas, adrenal glands, or spleen. Mildly hypermetabolic gastrohepatic ligament lymph nodes SUV max equaled 2.6. Nodular shrunken liver. Moderate ascites. Enlarged spleen. Multiple gallstones. Kidneys. SKELETON No aggressive osseous lesion. IMPRESSION: 1. Hypermetabolic LEFT upper lobe pulmonary nodule most consists with BRONCHOGENIC CARCINOMA. No clear evidence of metastatic disease.  2. No evidence of mediastinal nodal metastasis. 3. Small focus of metabolic activity along the distal esophagus is indeterminate. Favor inflammatory and likely related to cirrhosis and portal hypertension. 4. Asymmetric hypermetabolic activity in the RIGHT soft palate and RIGHT lingual tonsil is indeterminate and likely reactive or physiologic. Consider direct visualization. 5. Stigmata of cirrhosis and portal hypertension with thickening along the esophagus, epigastric varices, upper abdominal adenopathy, splenomegaly, and shrunken liver. Electronically Signed   By: Suzy Bouchard M.D.   On: 10/27/2015 15:09    ASSESSMENT: Left upper lobe pulmonary nodule, thrombocytopenia.  PLAN:    1. Left upper lobe pulmonary nodule: PET scan results reviewed independently concerning for malignancy. Will order a CT-guided biopsy of lesion which will require platelet transfusion 1-2 days prior to biopsy. Patient will then follow-up on November 11, 2015 with both medical and radiation oncology to discuss the results and treatment planning. Given his significant thrombocytopenia, he is likely not a surgical candidate.  2.  Pancytopenia: Mild. All of patient's other lab work is either negative or within normal limits. Likely multifactorial including dialysis, cirrhosis and splenomegaly.  Patient does not require a bone marrow biopsy at this time.  No intervention is needed. 3. Thrombocytopenia: Decreased but stable. Secondary to cirrhosis, splenomegaly, and positive platelet antibodies. Although these antibodies can be transient in nature. Platelet transfusion as above for biopsy.  4.  Renal failure: Continue dialysis as indicated. 5.  Hypokalemia: Replacement as needed with dialysis.  6.  Anemia: Case discussed with nephrology and will hold Procrit at this time.  Patient expressed understanding and was in agreement with this plan. He also understands that He can call clinic at any time with any questions, concerns, or complaints.    Lloyd Huger, MD   11/02/2015 10:44 AM

## 2015-11-02 ENCOUNTER — Inpatient Hospital Stay: Payer: Medicaid Other | Attending: Oncology | Admitting: Oncology

## 2015-11-02 VITALS — BP 124/71 | HR 63 | Temp 97.0°F | Resp 18 | Wt 177.1 lb

## 2015-11-02 DIAGNOSIS — D696 Thrombocytopenia, unspecified: Secondary | ICD-10-CM

## 2015-11-02 DIAGNOSIS — Z79899 Other long term (current) drug therapy: Secondary | ICD-10-CM

## 2015-11-02 DIAGNOSIS — D61818 Other pancytopenia: Secondary | ICD-10-CM | POA: Insufficient documentation

## 2015-11-02 DIAGNOSIS — B1920 Unspecified viral hepatitis C without hepatic coma: Secondary | ICD-10-CM | POA: Diagnosis not present

## 2015-11-02 DIAGNOSIS — K219 Gastro-esophageal reflux disease without esophagitis: Secondary | ICD-10-CM | POA: Diagnosis not present

## 2015-11-02 DIAGNOSIS — F419 Anxiety disorder, unspecified: Secondary | ICD-10-CM | POA: Diagnosis not present

## 2015-11-02 DIAGNOSIS — K7460 Unspecified cirrhosis of liver: Secondary | ICD-10-CM | POA: Insufficient documentation

## 2015-11-02 DIAGNOSIS — I10 Essential (primary) hypertension: Secondary | ICD-10-CM | POA: Diagnosis not present

## 2015-11-02 DIAGNOSIS — R911 Solitary pulmonary nodule: Secondary | ICD-10-CM | POA: Diagnosis not present

## 2015-11-02 DIAGNOSIS — F17210 Nicotine dependence, cigarettes, uncomplicated: Secondary | ICD-10-CM | POA: Insufficient documentation

## 2015-11-02 DIAGNOSIS — I482 Chronic atrial fibrillation: Secondary | ICD-10-CM | POA: Diagnosis not present

## 2015-11-02 DIAGNOSIS — E786 Lipoprotein deficiency: Secondary | ICD-10-CM

## 2015-11-02 DIAGNOSIS — N186 End stage renal disease: Secondary | ICD-10-CM | POA: Insufficient documentation

## 2015-11-02 DIAGNOSIS — E876 Hypokalemia: Secondary | ICD-10-CM | POA: Insufficient documentation

## 2015-11-02 DIAGNOSIS — M1990 Unspecified osteoarthritis, unspecified site: Secondary | ICD-10-CM | POA: Insufficient documentation

## 2015-11-02 DIAGNOSIS — Z992 Dependence on renal dialysis: Secondary | ICD-10-CM | POA: Diagnosis not present

## 2015-11-02 NOTE — Progress Notes (Signed)
States is having frequent GI upset and unable to tolerate solids or liquids. Having nausea, vomiting, and diarrhea.

## 2015-11-05 ENCOUNTER — Emergency Department: Payer: Medicaid Other

## 2015-11-05 ENCOUNTER — Emergency Department
Admission: EM | Admit: 2015-11-05 | Discharge: 2015-11-06 | Disposition: A | Discharge status: Home / Self Care | Payer: Medicaid Other | Attending: Emergency Medicine | Admitting: Emergency Medicine

## 2015-11-05 ENCOUNTER — Other Ambulatory Visit: Payer: Self-pay

## 2015-11-05 DIAGNOSIS — Y929 Unspecified place or not applicable: Secondary | ICD-10-CM | POA: Insufficient documentation

## 2015-11-05 DIAGNOSIS — R110 Nausea: Secondary | ICD-10-CM | POA: Insufficient documentation

## 2015-11-05 DIAGNOSIS — S60521A Blister (nonthermal) of right hand, initial encounter: Secondary | ICD-10-CM | POA: Diagnosis not present

## 2015-11-05 DIAGNOSIS — F17210 Nicotine dependence, cigarettes, uncomplicated: Secondary | ICD-10-CM | POA: Diagnosis not present

## 2015-11-05 DIAGNOSIS — R0602 Shortness of breath: Secondary | ICD-10-CM | POA: Diagnosis present

## 2015-11-05 DIAGNOSIS — X58XXXA Exposure to other specified factors, initial encounter: Secondary | ICD-10-CM | POA: Insufficient documentation

## 2015-11-05 DIAGNOSIS — I120 Hypertensive chronic kidney disease with stage 5 chronic kidney disease or end stage renal disease: Secondary | ICD-10-CM | POA: Diagnosis not present

## 2015-11-05 DIAGNOSIS — N186 End stage renal disease: Secondary | ICD-10-CM | POA: Insufficient documentation

## 2015-11-05 DIAGNOSIS — Y939 Activity, unspecified: Secondary | ICD-10-CM | POA: Insufficient documentation

## 2015-11-05 DIAGNOSIS — J449 Chronic obstructive pulmonary disease, unspecified: Secondary | ICD-10-CM | POA: Diagnosis not present

## 2015-11-05 DIAGNOSIS — Z992 Dependence on renal dialysis: Secondary | ICD-10-CM | POA: Diagnosis not present

## 2015-11-05 DIAGNOSIS — Y999 Unspecified external cause status: Secondary | ICD-10-CM | POA: Insufficient documentation

## 2015-11-05 DIAGNOSIS — J45909 Unspecified asthma, uncomplicated: Secondary | ICD-10-CM | POA: Diagnosis not present

## 2015-11-05 LAB — COMPREHENSIVE METABOLIC PANEL
ALT: 41 U/L (ref 17–63)
AST: 50 U/L — ABNORMAL HIGH (ref 15–41)
Albumin: 3.3 g/dL — ABNORMAL LOW (ref 3.5–5.0)
Alkaline Phosphatase: 57 U/L (ref 38–126)
Anion gap: 7 (ref 5–15)
BILIRUBIN TOTAL: 0.6 mg/dL (ref 0.3–1.2)
BUN: 27 mg/dL — AB (ref 6–20)
CALCIUM: 7.7 mg/dL — AB (ref 8.9–10.3)
CO2: 25 mmol/L (ref 22–32)
CREATININE: 4.57 mg/dL — AB (ref 0.61–1.24)
Chloride: 102 mmol/L (ref 101–111)
GFR, EST AFRICAN AMERICAN: 15 mL/min — AB (ref >60)
GFR, EST NON AFRICAN AMERICAN: 13 mL/min — AB (ref >60)
Glucose, Bld: 81 mg/dL (ref 65–99)
Potassium: 3.4 mmol/L — ABNORMAL LOW (ref 3.5–5.1)
Sodium: 134 mmol/L — ABNORMAL LOW (ref 135–145)
Total Protein: 6.4 g/dL — ABNORMAL LOW (ref 6.5–8.1)

## 2015-11-05 LAB — CBC WITH DIFFERENTIAL/PLATELET
BASOS ABS: 0 10*3/uL (ref 0–0.1)
Basophils Relative: 1 %
Eosinophils Absolute: 0.1 10*3/uL (ref 0–0.7)
HCT: 30.5 % — ABNORMAL LOW (ref 40.0–52.0)
Hemoglobin: 10.4 g/dL — ABNORMAL LOW (ref 13.0–18.0)
LYMPHS ABS: 0.5 10*3/uL — AB (ref 1.0–3.6)
MCH: 33.6 pg (ref 26.0–34.0)
MCHC: 34.1 g/dL (ref 32.0–36.0)
MCV: 98.4 fL (ref 80.0–100.0)
MONO ABS: 0.2 10*3/uL (ref 0.2–1.0)
Neutro Abs: 1.3 10*3/uL — ABNORMAL LOW (ref 1.4–6.5)
Neutrophils Relative %: 61 %
PLATELETS: 66 10*3/uL — AB (ref 150–440)
RBC: 3.1 MIL/uL — ABNORMAL LOW (ref 4.40–5.90)
RDW: 14.4 % (ref 11.5–14.5)
WBC: 2.1 10*3/uL — ABNORMAL LOW (ref 3.8–10.6)

## 2015-11-05 LAB — BRAIN NATRIURETIC PEPTIDE: B Natriuretic Peptide: 234 pg/mL — ABNORMAL HIGH (ref 0.0–100.0)

## 2015-11-05 LAB — MAGNESIUM: Magnesium: 1.8 mg/dL (ref 1.7–2.4)

## 2015-11-05 LAB — TROPONIN I: Troponin I: <0.03 ng/mL

## 2015-11-05 MED ORDER — ONDANSETRON HCL 4 MG/2ML IJ SOLN
INTRAMUSCULAR | Status: AC
  Administered 2015-11-05: 4 mg via INTRAVENOUS
  Filled 2015-11-05: qty 2

## 2015-11-05 MED ORDER — LORAZEPAM 2 MG/ML IJ SOLN
1.0000 mg | Freq: Once | INTRAMUSCULAR | Status: AC
  Administered 2015-11-05: 1 mg via INTRAVENOUS

## 2015-11-05 MED ORDER — LORAZEPAM 2 MG/ML IJ SOLN
INTRAMUSCULAR | Status: AC
  Administered 2015-11-05: 1 mg via INTRAVENOUS
  Filled 2015-11-05: qty 1

## 2015-11-05 MED ORDER — ONDANSETRON HCL 4 MG/2ML IJ SOLN
4.0000 mg | Freq: Once | INTRAMUSCULAR | Status: AC
  Administered 2015-11-05: 4 mg via INTRAVENOUS

## 2015-11-05 MED ORDER — IPRATROPIUM-ALBUTEROL 0.5-2.5 (3) MG/3ML IN SOLN
3.0000 mL | Freq: Once | RESPIRATORY_TRACT | Status: AC
  Administered 2015-11-05: 3 mL via RESPIRATORY_TRACT
  Filled 2015-11-05: qty 3

## 2015-11-05 MED ORDER — OXYCODONE HCL 5 MG PO TABS
5.0000 mg | ORAL_TABLET | Freq: Once | ORAL | Status: AC
  Administered 2015-11-05: 5 mg via ORAL
  Filled 2015-11-05: qty 1

## 2015-11-05 NOTE — ED Provider Notes (Signed)
Apolonio Schneiders, attending physician, personally viewed and interpreted this EKG  EKG Time: 2137 Rate: 69 Rhythm: normal sinus rhythm Axis: normal Intervals: qtc 490 QRS: narrow ST changes: no st elevation Impression: abnormal ekg (prolonged qt)    Nance Pear, MD 11/05/15 2140

## 2015-11-05 NOTE — ED Triage Notes (Signed)
Patient ambulatory to triage with steady gait, without difficulty or distress noted; pt reports larger blister to right index finger for few weeks, larger now and painful; denies any known injury

## 2015-11-05 NOTE — ED Provider Notes (Signed)
[facility] Emergency Department Provider Note  ____________________________________________   First MD Initiated Contact with Patient 11/05/15 2048     (approximate)  I have reviewed the triage vital signs and the nursing notes.   HISTORY  Chief Complaint Wound Check   HPI Richard Herring is a 60 y.o. male who presents to the emergency department for multiple medical complaints. He states that he has had a blister on his right hand for the past few days with increasing pain, but is unsure what happened to cause it. He also states that he is having an increase in shortness of breath from his baseline. He states that he has lung cancer as well as CKD, and cirrhosis of the liver. He had dialysis today without any problems, but started to feel more short of breath when he got here to have his hand evaluated. He denies chest pain.    Past Medical History:  Diagnosis Date  . Anemia of chronic disease   . Anxiety   . Arthritis   . Asthma   . Chronic atrial fibrillation (HCC)    a. not on long term full-dose anticoagulaiton 2/2 thrombocytopenia; CHADS2VASc 0 based on available data  . Cirrhosis ([place])   . COPD (chronic obstructive pulmonary disease) ([place])   . Depression   . ESRD on dialysis ([place])   . GERD (gastroesophageal reflux disease)   . Gout   . Hepatitis C   . Hypertension   . Incarceration   . Renal insufficiency    Pt takes Dialysis Tues, Thursday and Saturday.   . Stomach ulcer   . Thrombocytopenia [facility])     Patient Active Problem List   Diagnosis Date Noted  . Pulmonary nodule, left 09/14/2015  . Acute bronchitis 09/14/2015  . Musculoskeletal chest pain 09/10/2015  . Chronic atrial fibrillation ([place]) 09/10/2015  . ESRD (end stage renal disease) on dialysis ([place]) 09/10/2015  . Hypokalemia 09/10/2015  . Status post placement of implantable loop recorder 09/10/2015  . COPD (chronic obstructive pulmonary disease) ([place]) 09/10/2015  . Cirrhosis  of liver ([place]) 09/10/2015  . Hepatitis C 09/10/2015  . Anemia of chronic disease 09/10/2015  . Thrombocytopenia ([place]) 09/10/2015  . Left sided chest pain   . Infection of AV graft for dialysis ([place]) 08/15/2015  . History of Fournier's gangrene 08/11/2015  . Acute gastroenteritis 07/01/2015    Past Surgical History:  Procedure Laterality Date  . CARDIAC DEFIBRILLATOR PLACEMENT  2015  . DIALYSIS FISTULA CREATION  2014  . PERIPHERAL VASCULAR CATHETERIZATION Left 08/21/2015   Procedure: A/V Shuntogram/Fistulagram;  Surgeon: Katha Cabal, MD;  Location: [place] [facility];  Service: Cardiovascular;  Laterality: Left;  . PERIPHERAL VASCULAR CATHETERIZATION N/A 08/21/2015   Procedure: A/V Shunt Intervention;  Surgeon: Katha Cabal, MD;  Location: [place] [facility];  Service: Cardiovascular;  Laterality: N/A;  . PERITONEAL WOUND DRAINAGE  2015,2016   gangreen      Prior to Admission medications   Medication Sig Start Date End Date Taking? Authorizing Provider  albuterol (PROVENTIL HFA;VENTOLIN HFA) 108 (90 Base) MCG/ACT inhaler Inhale 2 puffs into the lungs 4 (four) times daily as needed for wheezing or shortness of breath. 09/10/15   Hillary Bow, MD  aspirin EC 81 MG tablet Take 81 mg by mouth daily. Reported on 08/06/2015    Historical Provider, MD  buPROPion (WELLBUTRIN SR) 150 MG 12 hr tablet Take 150 mg by mouth daily.    Historical Provider, MD  calcium carbonate (TUMS -  DOSED IN MG ELEMENTAL CALCIUM) 500 MG chewable tablet Chew 2 tablets by mouth 4 (four) times daily as needed for indigestion or heartburn.    Historical Provider, MD  docusate sodium (COLACE) 100 MG capsule Take 1 capsule (100 mg total) by mouth 2 (two) times daily as needed for mild constipation. 09/10/15   Hillary Bow, MD  folic acid (FOLVITE) 1 MG tablet Take 1 mg by mouth.    Historical Provider, MD  furosemide (LASIX) 40 MG tablet Take 1 tablet (40 mg total) by mouth 2 (two) times daily. 09/10/15    Srikar Sudini, MD  hydrocortisone 2.5 % cream Apply 1 application topically 2 (two) times daily.    Historical Provider, MD  hydrOXYzine (ATARAX/VISTARIL) 25 MG tablet Take 25 mg by mouth every 6 (six) hours as needed for anxiety.    Historical Provider, MD  LORazepam (ATIVAN) 0.5 MG tablet Take 0.5 mg by mouth 2 (two) times daily as needed for anxiety.    Historical Provider, MD  metoprolol (LOPRESSOR) 50 MG tablet Take 1 tablet (50 mg total) by mouth 2 (two) times daily. Reported on 08/21/2015 09/10/15   Hillary Bow, MD  ondansetron (ZOFRAN) 4 MG tablet Take 4 mg by mouth every 8 (eight) hours as needed for nausea or vomiting.    Historical Provider, MD  pramoxine (PROCTOFOAM) 1 % foam Place 1 application rectally 3 (three) times daily as needed for itching. 09/10/15   Hillary Bow, MD  pramoxine-hydrocortisone 1-1 % foam Apply 1 applicator topically 3 (three) times daily.    Historical Provider, MD  QUEtiapine (SEROQUEL) 100 MG tablet Take 1 tablet (100 mg total) by mouth 2 (two) times daily. 09/10/15   Hillary Bow, MD  ranitidine (ZANTAC) 300 MG tablet Take 1 tablet (300 mg total) by mouth 2 (two) times daily. 09/10/15   Hillary Bow, MD  sevelamer carbonate (RENVELA) 800 MG tablet Take 1 tablet (800 mg total) by mouth 3 (three) times daily with meals. 09/10/15   Srikar Sudini, MD  triamcinolone (NASACORT ALLERGY 24HR) 55 MCG/ACT AERO nasal inhaler Place 1 spray into the nose daily. 09/10/15   Srikar Sudini, MD  vitamin B-12 (CYANOCOBALAMIN) 1000 MCG tablet Take 1,000 mcg by mouth daily.    Historical Provider, MD  vitamin C (ASCORBIC ACID) 500 MG tablet Take 500 mg by mouth daily.    Historical Provider, MD    Allergies Review of patient's allergies indicates no known allergies.  Family History  Problem Relation Age of Onset  . Kidney cancer Neg Hx   . Prostate cancer Neg Hx     Social History Social History  Substance Use Topics  . Smoking status: Current Every Day Smoker     Packs/day: 1.50    Years: 30.00    Types: Cigarettes  . Smokeless tobacco: Never Used     Comment: pt states he stopped smoking X10 years  . Alcohol use 0.0 oz/week     Comment: drinks beer    Review of Systems Constitutional: No fever/chills Cardiovascular: Denies chest pain. Respiratory: Positive for shortness of breath. Gastrointestinal: No abdominal pain.  Positive for nausea, no vomiting.  No diarrhea.  No constipation. Musculoskeletal: Negative for back pain. Skin: Positive for blister to the right hand and left index finger. Neurological: Negative for headaches, focal weakness or numbness. ____________________________________________   PHYSICAL EXAM:  VITAL SIGNS: ED Triage Vitals  Enc Vitals Group     BP 11/05/15 1927 114/68     Pulse Rate 11/05/15 1927 72  Resp 11/05/15 1927 18     Temp 11/05/15 1927 98.2 F (36.8 C)     Temp src --      SpO2 11/05/15 1927 97 %     Weight 11/05/15 1928 172 lb (78 kg)     Height 11/05/15 1928 '5\' 10"'$  (1.778 m)     Head Circumference --      Peak Flow --      Pain Score 11/05/15 1928 10     Pain Loc --      Pain Edu? --      Excl. in [place]? --     Constitutional: Alert and oriented. Uncomfortable appearing and in no acute distress. Eyes: Conjunctivae are normal.  EOMI. Head: Atraumatic. Nose: No congestion/rhinnorhea. Mouth/Throat: Mucous membranes are moist. Neck: No stridor.   Cardiovascular: Good peripheral circulation. Respiratory: Normal respiratory effort.  No retractions. Lungs diminished throughout with faint rhonchi on RUL on expiration. Gastrointestinal: Soft and nontender. Mildly distended. No abdominal bruits. Musculoskeletal: No lower extremity tenderness nor edema.  No joint effusions. Neurologic:  Normal speech and language. No gross focal neurologic deficits are appreciated. No gait instability. Skin: 10cm large fluid filled vesicle to the dorsal aspect of the right hand on the thumb side. Small vesicle to  the left index finger.  Psychiatric: Mood and affect are normal. Speech and behavior are normal.  ____________________________________________   LABS (all labs ordered are listed, but only abnormal results are displayed)  Labs Reviewed  CBC WITH DIFFERENTIAL/PLATELET - Abnormal; Notable for the following:       Result Value   WBC 2.1 (*)    RBC 3.10 (*)    Hemoglobin 10.4 (*)    HCT 30.5 (*)    Platelets 66 (*)    Neutro Abs 1.3 (*)    Lymphs Abs 0.5 (*)    All other components within normal limits  COMPREHENSIVE METABOLIC PANEL - Abnormal; Notable for the following:    Sodium 134 (*)    Potassium 3.4 (*)    BUN 27 (*)    Creatinine, Ser 4.57 (*)    Calcium 7.7 (*)    Total Protein 6.4 (*)    Albumin 3.3 (*)    AST 50 (*)    GFR calc non Af Amer 13 (*)    GFR calc Af Amer 15 (*)    All other components within normal limits  BRAIN NATRIURETIC PEPTIDE - Abnormal; Notable for the following:    B Natriuretic Peptide 234.0 (*)    All other components within normal limits  TROPONIN I  MAGNESIUM   ____________________________________________  EKG   Date: 11/06/2015  Rate: 69  Rhythm: normal sinus rhythm  QRS Axis: normal  Intervals: normal  ST/T Wave abnormalities: normal  Conduction Disutrbances: none  Narrative Interpretation: Normal sinus rhythm with prolonged QT  ____________________________________________  RADIOLOGY  Chest x-ray without changes from previous per radiology. ____________________________________________   PROCEDURES  Procedure(s) performed:  Vesicle on the right hand cleansed with betadine and a #18 gauge needle inserted. 24m of Serosanguineous fluid drained. Drainage did not appear cloudy. The school is deflated and the skin collapsed down in a natural position.  Procedures  Critical Care performed: No  ____________________________________________   INITIAL IMPRESSION / ASSESSMENT AND PLAN / ED COURSE  Pertinent labs & imaging  results that were available during my care of the patient were reviewed by me and considered in my medical decision making (see chart for details).  Lab values appear to be  near baseline and there are no acute concerns identified tonight. He was encouraged to continue his dialysis as well as following up with his specialists as scheduled. Patient was instructed to return to the emergency department for any symptom that changes or worsens that he is unable schedule an appointment.  Clinical Course     ____________________________________________   FINAL CLINICAL IMPRESSION(S) / ED DIAGNOSES  Final diagnoses:  Chronic obstructive pulmonary disease, unspecified COPD type ([place])  Blister of right hand, initial encounter      NEW MEDICATIONS STARTED DURING THIS VISIT:  Discharge Medication List as of 11/05/2015 11:24 PM       Note:  This document was prepared using Dragon voice recognition software and may include unintentional dictation errors.    Victorino Dike, FNP 11/06/15 0013    Harvest Dark, MD 11/07/15 0700

## 2015-11-05 NOTE — ED Notes (Signed)
Pt ambulated to the bathroom without difficulty.  

## 2015-11-09 ENCOUNTER — Other Ambulatory Visit: Payer: Self-pay | Admitting: Oncology

## 2015-11-09 DIAGNOSIS — D696 Thrombocytopenia, unspecified: Secondary | ICD-10-CM

## 2015-11-11 ENCOUNTER — Ambulatory Visit: Payer: Medicaid Other | Admitting: Radiation Oncology

## 2015-11-11 ENCOUNTER — Inpatient Hospital Stay: Payer: Medicaid Other | Admitting: Oncology

## 2015-11-13 ENCOUNTER — Other Ambulatory Visit: Payer: Self-pay | Admitting: Radiology

## 2015-11-16 ENCOUNTER — Ambulatory Visit
Admission: RE | Admit: 2015-11-16 | Discharge: 2015-11-16 | Disposition: A | Discharge status: Home / Self Care | Payer: Medicaid Other | Source: Ambulatory Visit | Attending: Oncology | Admitting: Oncology

## 2015-11-16 MED ORDER — SODIUM CHLORIDE 0.9 % IV SOLN
INTRAVENOUS | Status: DC
Start: 2015-11-16 — End: 2015-11-16

## 2015-11-23 NOTE — Progress Notes (Deleted)
[facility]  [phone] [phone]  ID: Richard Herring OB: 1955-08-26  MR#: [id number]  ZMO#:[id number]  Patient Care Team: Murlean Iba, MD as PCP - General (Internal Medicine)  CHIEF COMPLAINT: Left upper lobe pulmonary nodule, thrombocytopenia.  INTERVAL HISTORY: Patient returns to clinic today for further evaluation and discussion of his PET scan results. He continues to have nonspecific pain "all over". He also states he is more weak and fatigued. He has no neurologic complaints.  He denies any recent fevers or illnesses.  He denies any easy bleeding or bruising. He has a fair appetite and denies weight loss. He has no chest pain, cough, shortness of breath, or hemoptysis. He denies any nausea, vomiting, constipation, or diarrhea. He has no melena or hematochezia. He has no urinary complaints. Patient offers no further specific complaints today.    REVIEW OF SYSTEMS:   Review of Systems  Constitutional: Positive for malaise/fatigue. Negative for fever and weight loss.  Respiratory: Negative.  Negative for cough, hemoptysis and shortness of breath.   Cardiovascular: Negative.  Negative for chest pain.  Gastrointestinal: Positive for abdominal pain. Negative for blood in stool, constipation, diarrhea, melena, nausea and vomiting.  Genitourinary: Negative.   Musculoskeletal: Positive for back pain.  Neurological: Positive for weakness.  Endo/Heme/Allergies: Does not bruise/bleed easily.  Psychiatric/Behavioral: Negative.  The patient is not nervous/anxious.     As per HPI. Otherwise, a complete review of systems is negatve.  PAST MEDICAL HISTORY: Past Medical History:  Diagnosis Date  . Anemia of chronic disease   . Anxiety   . Arthritis   . Asthma   . Chronic atrial fibrillation (HCC)    a. not on long term full-dose anticoagulaiton 2/2 thrombocytopenia; CHADS2VASc 0 based on available data  . Cirrhosis ([place])   . COPD (chronic  obstructive pulmonary disease) ([place])   . Depression   . ESRD on dialysis ([place])   . GERD (gastroesophageal reflux disease)   . Gout   . Hepatitis C   . Hypertension   . Incarceration   . Renal insufficiency    Pt takes Dialysis Tues, Thursday and Saturday.   . Stomach ulcer   . Thrombocytopenia ([place])     PAST SURGICAL HISTORY: Past Surgical History:  Procedure Laterality Date  . CARDIAC DEFIBRILLATOR PLACEMENT  2015  . DIALYSIS FISTULA CREATION  2014  . PERIPHERAL VASCULAR CATHETERIZATION Left 08/21/2015   Procedure: A/V Shuntogram/Fistulagram;  Surgeon: Katha Cabal, MD;  Location: [place] [facility];  Service: Cardiovascular;  Laterality: Left;  . PERIPHERAL VASCULAR CATHETERIZATION N/A 08/21/2015   Procedure: A/V Shunt Intervention;  Surgeon: Katha Cabal, MD;  Location: [place] [facility];  Service: Cardiovascular;  Laterality: N/A;  . PERITONEAL WOUND DRAINAGE  2015,2016   gangreen      FAMILY HISTORY: Family History  Problem Relation Age of Onset  . Kidney cancer Neg Hx   . Prostate cancer Neg Hx        ADVANCED DIRECTIVES:    HEALTH MAINTENANCE: Social History  Substance Use Topics  . Smoking status: Current Every Day Smoker    Packs/day: 1.50    Years: 30.00    Types: Cigarettes  . Smokeless tobacco: Never Used     Comment: pt states he stopped smoking X10 years  . Alcohol use 0.0 oz/week     Comment: drinks beer     Colonoscopy:  PAP:  Bone density:  Lipid panel:  No Known Allergies  Current Outpatient Prescriptions  Medication  Sig Dispense Refill  . albuterol (PROVENTIL HFA;VENTOLIN HFA) 108 (90 Base) MCG/ACT inhaler Inhale 2 puffs into the lungs 4 (four) times daily as needed for wheezing or shortness of breath. 1 Inhaler 0  . aspirin EC 81 MG tablet Take 81 mg by mouth daily. Reported on 08/06/2015    . buPROPion (WELLBUTRIN SR) 150 MG 12 hr tablet Take 150 mg by mouth daily.    . calcium carbonate (TUMS - DOSED IN MG ELEMENTAL  CALCIUM) 500 MG chewable tablet Chew 2 tablets by mouth 4 (four) times daily as needed for indigestion or heartburn.    . docusate sodium (COLACE) 100 MG capsule Take 1 capsule (100 mg total) by mouth 2 (two) times daily as needed for mild constipation. 20 capsule 0  . folic acid (FOLVITE) 1 MG tablet Take 1 mg by mouth.    . furosemide (LASIX) 40 MG tablet Take 1 tablet (40 mg total) by mouth 2 (two) times daily. 60 tablet 0  . hydrocortisone 2.5 % cream Apply 1 application topically 2 (two) times daily.    . hydrOXYzine (ATARAX/VISTARIL) 25 MG tablet Take 25 mg by mouth every 6 (six) hours as needed for anxiety.    Marland Kitchen LORazepam (ATIVAN) 0.5 MG tablet Take 0.5 mg by mouth 2 (two) times daily as needed for anxiety.    . metoprolol (LOPRESSOR) 50 MG tablet Take 1 tablet (50 mg total) by mouth 2 (two) times daily. Reported on 08/21/2015 60 tablet 0  . ondansetron (ZOFRAN) 4 MG tablet Take 4 mg by mouth every 8 (eight) hours as needed for nausea or vomiting.    . pramoxine (PROCTOFOAM) 1 % foam Place 1 application rectally 3 (three) times daily as needed for itching. 15 g 0  . pramoxine-hydrocortisone 1-1 % foam Apply 1 applicator topically 3 (three) times daily.    . QUEtiapine (SEROQUEL) 100 MG tablet Take 1 tablet (100 mg total) by mouth 2 (two) times daily. 60 tablet 0  . ranitidine (ZANTAC) 300 MG tablet Take 1 tablet (300 mg total) by mouth 2 (two) times daily. 60 tablet 0  . sevelamer carbonate (RENVELA) 800 MG tablet Take 1 tablet (800 mg total) by mouth 3 (three) times daily with meals. 90 tablet 0  . triamcinolone (NASACORT ALLERGY 24HR) 55 MCG/ACT AERO nasal inhaler Place 1 spray into the nose daily. 1 Inhaler 0  . vitamin B-12 (CYANOCOBALAMIN) 1000 MCG tablet Take 1,000 mcg by mouth daily.    . vitamin C (ASCORBIC ACID) 500 MG tablet Take 500 mg by mouth daily.     No current facility-administered medications for this visit.     OBJECTIVE: There were no vitals filed for this visit.    There is no height or weight on file to calculate BMI.    ECOG FS:0 - Asymptomatic  General: Well-developed, well-nourished, no acute distress. Eyes: Pink conjunctiva, anicteric sclera. Lungs: Clear to auscultation bilaterally. Heart: Regular rate and rhythm. No rubs, murmurs, or gallops. Abdomen: Soft, nontender, nondistended. No organomegaly noted, normoactive bowel sounds. Musculoskeletal: No edema, cyanosis, or clubbing. Dialysis Catheter in right chest wall, surgery to repair fistula and upper left arm. Neuro: Alert, answering all questions appropriately. Cranial nerves grossly intact. Skin: No rashes or petechiae noted. Psych: Normal affect.  LAB RESULTS:  Lab Results  Component Value Date   NA 134 (L) 11/05/2015   K 3.4 (L) 11/05/2015   CL 102 11/05/2015   CO2 25 11/05/2015   GLUCOSE 81 11/05/2015   BUN 27 (H)  11/05/2015   CREATININE 4.57 (H) 11/05/2015   CALCIUM 7.7 (L) 11/05/2015   PROT 6.4 (L) 11/05/2015   ALBUMIN 3.3 (L) 11/05/2015   AST 50 (H) 11/05/2015   ALT 41 11/05/2015   ALKPHOS 57 11/05/2015   BILITOT 0.6 11/05/2015   GFRNONAA 13 (L) 11/05/2015   GFRAA 15 (L) 11/05/2015    Lab Results  Component Value Date   WBC 2.1 (L) 11/05/2015   NEUTROABS 1.3 (L) 11/05/2015   HGB 10.4 (L) 11/05/2015   HCT 30.5 (L) 11/05/2015   MCV 98.4 11/05/2015   PLT 66 (L) 11/05/2015     STUDIES: Dg Chest 2 View  Result Date: 11/05/2015 CLINICAL DATA:  Weakness and dyspnea today EXAM: CHEST  2 VIEW COMPARISON:  09/10/2015, 10/27/2015 FINDINGS: Right jugular dual-lumen central line extends into the SVC. Loop recorder superimposes the left hemithorax. Left upper lobe mass again evident, unchanged. The lungs are otherwise clear. Hilar and mediastinal contours are unremarkable and unchanged. Normal heart size. No pleural effusion. Normal pulmonary vasculature. IMPRESSION: 1. Left upper lobe mass, unchanged. 2. No acute cardiopulmonary findings. Electronically Signed   By: Andreas Newport M.D.   On: 11/05/2015 22:01   Nm Pet Image Initial (pi) Skull Base To Thigh  Result Date: 10/27/2015 CLINICAL DATA:  Initial treatment strategy for solitary pulmonary nodule. EXAM: NUCLEAR MEDICINE PET SKULL BASE TO THIGH TECHNIQUE: 13.2 mCi F-18 FDG was injected intravenously. Full-ring PET imaging was performed from the skull base to thigh after the radiotracer. CT data was obtained and used for attenuation correction and anatomic localization. FASTING BLOOD GLUCOSE:  Value: 92 mg/dl COMPARISON:  CT chest 10/07/2015 FINDINGS: NECK No hypermetabolic lymph nodes in the neck. There is focus of metabolic activity at the junction of the hard palate and soft palate on the RIGHT with SUV max equal 2.6 which is relatively mild activity. Mild asymmetric activity in the RIGHT tonsil/ base of tongue with SUV max equaled 2.5. No CT abnormality to correlate with these metabolic foci. CHEST Intense metabolic activity associated with LEFT upper lobe pulmonary nodule measuring 18 mm with SUV max equal 4.8. No additional hypermetabolic nodules. No hypermetabolic mediastinal lymph nodes. Diffuse esophageal thickening again noted. There is a focus of metabolic activity associated with the anterior wall of the distal esophagus approximately 2 cm above the GE junction with SUV max equal 3.2 ABDOMEN/PELVIS No abnormal hypermetabolic activity within the liver, pancreas, adrenal glands, or spleen. Mildly hypermetabolic gastrohepatic ligament lymph nodes SUV max equaled 2.6. Nodular shrunken liver. Moderate ascites. Enlarged spleen. Multiple gallstones. Kidneys. SKELETON No aggressive osseous lesion. IMPRESSION: 1. Hypermetabolic LEFT upper lobe pulmonary nodule most consists with BRONCHOGENIC CARCINOMA. No clear evidence of metastatic disease. 2. No evidence of mediastinal nodal metastasis. 3. Small focus of metabolic activity along the distal esophagus is indeterminate. Favor inflammatory and likely related to cirrhosis and  portal hypertension. 4. Asymmetric hypermetabolic activity in the RIGHT soft palate and RIGHT lingual tonsil is indeterminate and likely reactive or physiologic. Consider direct visualization. 5. Stigmata of cirrhosis and portal hypertension with thickening along the esophagus, epigastric varices, upper abdominal adenopathy, splenomegaly, and shrunken liver. Electronically Signed   By: Suzy Bouchard M.D.   On: 10/27/2015 15:09    ASSESSMENT: Left upper lobe pulmonary nodule, thrombocytopenia.  PLAN:    1. Left upper lobe pulmonary nodule: PET scan results reviewed independently concerning for malignancy. Will order a CT-guided biopsy of lesion which will require platelet transfusion 1-2 days prior to biopsy. Patient will then follow-up  on November 11, 2015 with both medical and radiation oncology to discuss the results and treatment planning. Given his significant thrombocytopenia, he is likely not a surgical candidate.  2. Pancytopenia: Mild. All of patient's other lab work is either negative or within normal limits. Likely multifactorial including dialysis, cirrhosis and splenomegaly.  Patient does not require a bone marrow biopsy at this time.  No intervention is needed. 3. Thrombocytopenia: Decreased but stable. Secondary to cirrhosis, splenomegaly, and positive platelet antibodies. Although these antibodies can be transient in nature. Platelet transfusion as above for biopsy.  4.  Renal failure: Continue dialysis as indicated. 5.  Hypokalemia: Replacement as needed with dialysis.  6.  Anemia: Case discussed with nephrology and will hold Procrit at this time.  Patient expressed understanding and was in agreement with this plan. He also understands that He can call clinic at any time with any questions, concerns, or complaints.    Richard Huger, MD   11/23/2015 2:27 PM

## 2015-11-25 ENCOUNTER — Ambulatory Visit
Admission: RE | Admit: 2015-11-25 | Discharge: 2015-11-25 | Disposition: A | Discharge status: Home / Self Care | Payer: Medicaid Other | Source: Ambulatory Visit | Attending: Radiation Oncology | Admitting: Radiation Oncology

## 2015-11-25 ENCOUNTER — Inpatient Hospital Stay: Payer: Medicaid Other | Admitting: Oncology

## 2015-11-25 ENCOUNTER — Encounter: Payer: Self-pay | Admitting: Radiation Oncology

## 2015-11-25 VITALS — BP 133/74 | HR 53 | Temp 97.3°F | Resp 20 | Wt 172.8 lb

## 2015-11-25 DIAGNOSIS — R911 Solitary pulmonary nodule: Secondary | ICD-10-CM

## 2015-11-25 DIAGNOSIS — Z510 Encounter for antineoplastic radiation therapy: Secondary | ICD-10-CM | POA: Diagnosis not present

## 2015-11-25 NOTE — Progress Notes (Signed)
Patient was supposed to have a CT-guided biopsy with prior blood transfusion missed all his appointments secondary to transportation needs. We have rescheduled all those appointments that are still waiting for CT biopsy and will see him in follow-up after that.

## 2015-12-01 ENCOUNTER — Other Ambulatory Visit: Payer: Self-pay | Admitting: Radiology

## 2015-12-01 ENCOUNTER — Other Ambulatory Visit: Payer: Self-pay | Admitting: Oncology

## 2015-12-01 DIAGNOSIS — D696 Thrombocytopenia, unspecified: Secondary | ICD-10-CM

## 2015-12-02 ENCOUNTER — Ambulatory Visit
Admission: RE | Admit: 2015-12-02 | Discharge: 2015-12-02 | Disposition: A | Discharge status: Home / Self Care | Payer: Medicaid Other | Source: Ambulatory Visit | Attending: Interventional Radiology | Admitting: Interventional Radiology

## 2015-12-02 ENCOUNTER — Other Ambulatory Visit: Payer: Self-pay | Admitting: *Deleted

## 2015-12-02 ENCOUNTER — Inpatient Hospital Stay: Payer: Medicaid Other

## 2015-12-02 ENCOUNTER — Ambulatory Visit
Admission: RE | Admit: 2015-12-02 | Discharge: 2015-12-02 | Disposition: A | Discharge status: Home / Self Care | Payer: Medicaid Other | Source: Ambulatory Visit | Attending: Oncology | Admitting: Oncology

## 2015-12-02 ENCOUNTER — Inpatient Hospital Stay: Payer: Medicaid Other | Attending: Oncology

## 2015-12-02 DIAGNOSIS — I482 Chronic atrial fibrillation: Secondary | ICD-10-CM | POA: Insufficient documentation

## 2015-12-02 DIAGNOSIS — F419 Anxiety disorder, unspecified: Secondary | ICD-10-CM | POA: Insufficient documentation

## 2015-12-02 DIAGNOSIS — D696 Thrombocytopenia, unspecified: Secondary | ICD-10-CM

## 2015-12-02 DIAGNOSIS — D631 Anemia in chronic kidney disease: Secondary | ICD-10-CM | POA: Diagnosis not present

## 2015-12-02 DIAGNOSIS — I120 Hypertensive chronic kidney disease with stage 5 chronic kidney disease or end stage renal disease: Secondary | ICD-10-CM | POA: Insufficient documentation

## 2015-12-02 DIAGNOSIS — Z79899 Other long term (current) drug therapy: Secondary | ICD-10-CM | POA: Insufficient documentation

## 2015-12-02 DIAGNOSIS — Z992 Dependence on renal dialysis: Secondary | ICD-10-CM | POA: Insufficient documentation

## 2015-12-02 DIAGNOSIS — R911 Solitary pulmonary nodule: Secondary | ICD-10-CM | POA: Insufficient documentation

## 2015-12-02 DIAGNOSIS — Z9889 Other specified postprocedural states: Secondary | ICD-10-CM | POA: Diagnosis not present

## 2015-12-02 DIAGNOSIS — Z95810 Presence of automatic (implantable) cardiac defibrillator: Secondary | ICD-10-CM | POA: Insufficient documentation

## 2015-12-02 DIAGNOSIS — N186 End stage renal disease: Secondary | ICD-10-CM | POA: Insufficient documentation

## 2015-12-02 DIAGNOSIS — B1920 Unspecified viral hepatitis C without hepatic coma: Secondary | ICD-10-CM | POA: Insufficient documentation

## 2015-12-02 DIAGNOSIS — F319 Bipolar disorder, unspecified: Secondary | ICD-10-CM | POA: Diagnosis not present

## 2015-12-02 DIAGNOSIS — F209 Schizophrenia, unspecified: Secondary | ICD-10-CM | POA: Diagnosis not present

## 2015-12-02 DIAGNOSIS — M1990 Unspecified osteoarthritis, unspecified site: Secondary | ICD-10-CM | POA: Insufficient documentation

## 2015-12-02 DIAGNOSIS — Z7982 Long term (current) use of aspirin: Secondary | ICD-10-CM | POA: Insufficient documentation

## 2015-12-02 DIAGNOSIS — Z01812 Encounter for preprocedural laboratory examination: Secondary | ICD-10-CM

## 2015-12-02 DIAGNOSIS — F17210 Nicotine dependence, cigarettes, uncomplicated: Secondary | ICD-10-CM | POA: Insufficient documentation

## 2015-12-02 DIAGNOSIS — J449 Chronic obstructive pulmonary disease, unspecified: Secondary | ICD-10-CM | POA: Insufficient documentation

## 2015-12-02 DIAGNOSIS — K7460 Unspecified cirrhosis of liver: Secondary | ICD-10-CM | POA: Diagnosis not present

## 2015-12-02 DIAGNOSIS — K219 Gastro-esophageal reflux disease without esophagitis: Secondary | ICD-10-CM | POA: Insufficient documentation

## 2015-12-02 DIAGNOSIS — M109 Gout, unspecified: Secondary | ICD-10-CM | POA: Insufficient documentation

## 2015-12-02 DIAGNOSIS — C3492 Malignant neoplasm of unspecified part of left bronchus or lung: Secondary | ICD-10-CM | POA: Diagnosis not present

## 2015-12-02 HISTORY — DX: Schizophrenia, unspecified: F20.9

## 2015-12-02 HISTORY — DX: Bipolar disorder, unspecified: F31.9

## 2015-12-02 HISTORY — DX: Headache: R51

## 2015-12-02 HISTORY — DX: Dyspnea, unspecified: R06.00

## 2015-12-02 HISTORY — DX: Malignant (primary) neoplasm, unspecified: C80.1

## 2015-12-02 HISTORY — DX: Headache, unspecified: R51.9

## 2015-12-02 LAB — CBC WITH DIFFERENTIAL/PLATELET
BASOS PCT: 0 %
Basophils Absolute: 0 10*3/uL (ref 0–0.1)
EOS PCT: 0 %
Eosinophils Absolute: 0 10*3/uL (ref 0–0.7)
HEMATOCRIT: 33.7 % — AB (ref 40.0–52.0)
Hemoglobin: 11.6 g/dL — ABNORMAL LOW (ref 13.0–18.0)
LYMPHS PCT: 12 %
Lymphs Abs: 0.4 10*3/uL — ABNORMAL LOW (ref 1.0–3.6)
MCH: 33.3 pg (ref 26.0–34.0)
MCHC: 34.3 g/dL (ref 32.0–36.0)
MCV: 97 fL (ref 80.0–100.0)
MONO ABS: 0.3 10*3/uL (ref 0.2–1.0)
MONOS PCT: 9 %
NEUTROS ABS: 2.7 10*3/uL (ref 1.4–6.5)
Neutrophils Relative %: 79 %
PLATELETS: 62 10*3/uL — AB (ref 150–440)
RBC: 3.47 MIL/uL — ABNORMAL LOW (ref 4.40–5.90)
RDW: 14.4 % (ref 11.5–14.5)
WBC: 3.5 10*3/uL — ABNORMAL LOW (ref 3.8–10.6)

## 2015-12-02 LAB — TYPE AND SCREEN
ABO/RH(D): A POS
ANTIBODY SCREEN: NEGATIVE

## 2015-12-02 LAB — PROTIME-INR
INR: 1.23
Prothrombin Time: 15.6 seconds — ABNORMAL HIGH (ref 11.4–15.2)

## 2015-12-02 MED ORDER — MIDAZOLAM HCL 5 MG/5ML IJ SOLN
INTRAMUSCULAR | Status: AC
  Filled 2015-12-02: qty 5

## 2015-12-02 MED ORDER — FENTANYL CITRATE (PF) 100 MCG/2ML IJ SOLN
INTRAMUSCULAR | Status: AC
  Filled 2015-12-02: qty 4

## 2015-12-02 MED ORDER — MIDAZOLAM HCL 5 MG/5ML IJ SOLN
INTRAMUSCULAR | Status: AC | PRN
  Administered 2015-12-02 (×2): 1 mg via INTRAVENOUS

## 2015-12-02 MED ORDER — FENTANYL CITRATE (PF) 100 MCG/2ML IJ SOLN
INTRAMUSCULAR | Status: AC | PRN
  Administered 2015-12-02: 50 ug via INTRAVENOUS

## 2015-12-02 MED ORDER — DIPHENHYDRAMINE HCL 50 MG/ML IJ SOLN
25.0000 mg | Freq: Once | INTRAMUSCULAR | Status: AC
  Administered 2015-12-02: 25 mg via INTRAVENOUS
  Filled 2015-12-02: qty 1

## 2015-12-02 MED ORDER — SODIUM CHLORIDE 0.9 % IV SOLN
INTRAVENOUS | Status: DC
  Administered 2015-12-02: 10 mL/h via INTRAVENOUS

## 2015-12-02 MED ORDER — SODIUM CHLORIDE 0.9 % IV SOLN
250.0000 mL | Freq: Once | INTRAVENOUS | Status: AC
  Administered 2015-12-02: 250 mL via INTRAVENOUS
  Filled 2015-12-02: qty 250

## 2015-12-02 MED ORDER — ACETAMINOPHEN 325 MG PO TABS
650.0000 mg | ORAL_TABLET | Freq: Once | ORAL | Status: AC
  Administered 2015-12-02: 650 mg via ORAL
  Filled 2015-12-02: qty 2

## 2015-12-02 NOTE — Consult Note (Signed)
Chief Complaint: Indeterminate hypermetabolic pulmonary nodule  Referring Physician(s): Finnegan,Timothy J  Patient Status: Out-pt [facility]  History of Present Illness: Richard Herring is a 60 y.o. male with past history significant for chronic atrial fibrillation, cirrhosis, COPD, ESRD on dialysis, anxiety and bipolar disorder who presents for CT-guided biopsy of a hypermetabolic left upper lobe pulmonary nodule. The patient is unaccompanied and serves as his own historian.  Patient complains of chronic shortness of breath though states this not different than his baseline. Patient also complains of diffuse abdominal pain which is associated with chronic diarrhea however again this is unchanged.  The patient otherwise without complaint. Specifically, no fever or chills. No hemoptysis.  The patient is noted to be somewhat lethargic though is oriented to person place and time.  Past Medical History:  Diagnosis Date  . Anemia of chronic disease   . Anxiety   . Arthritis   . Asthma   . Bipolar disorder ([place])   . Cancer ([place])    left lung nodule  . Chronic atrial fibrillation (HCC)    a. not on long term full-dose anticoagulaiton 2/2 thrombocytopenia; CHADS2VASc 0 based on available data  . Cirrhosis ([place])   . COPD (chronic obstructive pulmonary disease) ([place])   . Depression   . Dyspnea   . ESRD on dialysis ([place])   . GERD (gastroesophageal reflux disease)   . Gout   . Headache   . Hepatitis C   . Hypertension   . Incarceration   . Renal insufficiency    Pt takes Dialysis Tues, Thursday and Saturday.   . Schizophrenia ([place])   . Stomach ulcer   . Thrombocytopenia ([place])     Past Surgical History:  Procedure Laterality Date  . CARDIAC DEFIBRILLATOR PLACEMENT  2015  . DIALYSIS FISTULA CREATION  2014  . PERIPHERAL VASCULAR CATHETERIZATION Left 08/21/2015   Procedure: A/V Shuntogram/Fistulagram;  Surgeon: Katha Cabal, MD;  Location: [place] [facility];  Service:  Cardiovascular;  Laterality: Left;  . PERIPHERAL VASCULAR CATHETERIZATION N/A 08/21/2015   Procedure: A/V Shunt Intervention;  Surgeon: Katha Cabal, MD;  Location: [place] [facility];  Service: Cardiovascular;  Laterality: N/A;  . PERITONEAL WOUND DRAINAGE  2015,2016   gangreen      Allergies: Review of patient's allergies indicates no known allergies.  Medications: Prior to Admission medications   Medication Sig Start Date End Date Taking? Authorizing Provider  buPROPion (WELLBUTRIN SR) 150 MG 12 hr tablet Take 150 mg by mouth daily.   Yes Historical Provider, MD  hydrocortisone 2.5 % cream Apply 1 application topically 2 (two) times daily.   Yes Historical Provider, MD  LORazepam (ATIVAN) 0.5 MG tablet Take 0.5 mg by mouth 2 (two) times daily as needed for anxiety.   Yes Historical Provider, MD  ondansetron (ZOFRAN) 4 MG tablet Take 4 mg by mouth every 8 (eight) hours as needed for nausea or vomiting.   Yes Historical Provider, MD  sevelamer carbonate (RENVELA) 800 MG tablet Take 1 tablet (800 mg total) by mouth 3 (three) times daily with meals. 09/10/15  Yes Srikar Sudini, MD  vitamin B-12 (CYANOCOBALAMIN) 1000 MCG tablet Take 1,000 mcg by mouth daily.   Yes Historical Provider, MD  vitamin C (ASCORBIC ACID) 500 MG tablet Take 500 mg by mouth daily.   Yes Historical Provider, MD  albuterol (PROVENTIL HFA;VENTOLIN HFA) 108 (90 Base) MCG/ACT inhaler Inhale 2 puffs into the lungs 4 (four) times daily as needed for wheezing or shortness of breath.  09/10/15   Hillary Bow, MD  aspirin EC 81 MG tablet Take 81 mg by mouth daily. Reported on 08/06/2015    Historical Provider, MD  calcium carbonate (TUMS - DOSED IN MG ELEMENTAL CALCIUM) 500 MG chewable tablet Chew 2 tablets by mouth 4 (four) times daily as needed for indigestion or heartburn.    Historical Provider, MD  docusate sodium (COLACE) 100 MG capsule Take 1 capsule (100 mg total) by mouth 2 (two) times daily as needed for mild  constipation. Patient not taking: Reported on 12/02/2015 09/10/15   Hillary Bow, MD  folic acid (FOLVITE) 1 MG tablet Take 1 mg by mouth.    Historical Provider, MD  furosemide (LASIX) 40 MG tablet Take 1 tablet (40 mg total) by mouth 2 (two) times daily. Patient not taking: Reported on 12/02/2015 09/10/15   Hillary Bow, MD  hydrOXYzine (ATARAX/VISTARIL) 25 MG tablet Take 25 mg by mouth every 6 (six) hours as needed for anxiety.    Historical Provider, MD  metoprolol (LOPRESSOR) 50 MG tablet Take 1 tablet (50 mg total) by mouth 2 (two) times daily. Reported on 08/21/2015 Patient not taking: Reported on 12/02/2015 09/10/15   Hillary Bow, MD  pramoxine (PROCTOFOAM) 1 % foam Place 1 application rectally 3 (three) times daily as needed for itching. Patient not taking: Reported on 12/02/2015 09/10/15   Hillary Bow, MD  pramoxine-hydrocortisone 1-1 % foam Apply 1 applicator topically 3 (three) times daily.    Historical Provider, MD  QUEtiapine (SEROQUEL) 100 MG tablet Take 1 tablet (100 mg total) by mouth 2 (two) times daily. Patient not taking: Reported on 12/02/2015 09/10/15   Hillary Bow, MD  ranitidine (ZANTAC) 300 MG tablet Take 1 tablet (300 mg total) by mouth 2 (two) times daily. Patient not taking: Reported on 12/02/2015 09/10/15   Hillary Bow, MD  triamcinolone (NASACORT ALLERGY 24HR) 55 MCG/ACT AERO nasal inhaler Place 1 spray into the nose daily. Patient not taking: Reported on 12/02/2015 09/10/15   Hillary Bow, MD     Family History  Problem Relation Age of Onset  . Kidney cancer Neg Hx   . Prostate cancer Neg Hx     Social History   Social History  . Marital status: Single    Spouse name: N/A  . Number of children: N/A  . Years of education: N/A   Occupational History  . disabled    Social History Main Topics  . Smoking status: Current Every Day Smoker    Packs/day: 1.50    Years: 30.00    Types: Cigarettes  . Smokeless tobacco: Never Used     Comment: pt  reports smoke "alot"  . Alcohol use 0.0 oz/week     Comment: drinks beer  . Drug use: No  . Sexual activity: Not Currently   Other Topics Concern  . None   Social History Narrative  . None    ECOG Status: 1 - Symptomatic but completely ambulatory  Review of Systems: A 12 point ROS discussed and pertinent positives are indicated in the HPI above.  All other systems are negative.  Review of Systems  Constitutional: Negative for fever and unexpected weight change.  Respiratory: Positive for shortness of breath.   Cardiovascular: Negative.   Gastrointestinal: Positive for abdominal pain and diarrhea.    Vital Signs: BP 114/73   Temp 97.4 F (36.3 C) (Oral)   Resp 20   SpO2 99%   Physical Exam  Constitutional:  Appears older than his stated age.  Cardiovascular:  Irregularly irregular rhythm compatible with history of atrial fibrillation.  Pulmonary/Chest: Effort normal and breath sounds normal.  Abdominal: Soft. Bowel sounds are normal.  Psychiatric:  Patient is noted to be somewhat lethargic though is oriented to person, place and time.  Nursing note and vitals reviewed.   Mallampati Score:  MD Evaluation Airway: WNL Heart: WNL Abdomen:  (Patient complains of diffuse chronic abdominal pain.) Chest/ Lungs:  (Patient complains of chronic shortness of breath, not different than his baseline.) ASA  Classification: 2 Mallampati/Airway Score: Two  Imaging: Dg Chest 2 View  Result Date: 11/05/2015 CLINICAL DATA:  Weakness and dyspnea today EXAM: CHEST  2 VIEW COMPARISON:  09/10/2015, 10/27/2015 FINDINGS: Right jugular dual-lumen central line extends into the SVC. Loop recorder superimposes the left hemithorax. Left upper lobe mass again evident, unchanged. The lungs are otherwise clear. Hilar and mediastinal contours are unremarkable and unchanged. Normal heart size. No pleural effusion. Normal pulmonary vasculature. IMPRESSION: 1. Left upper lobe mass, unchanged. 2. No  acute cardiopulmonary findings. Electronically Signed   By: Andreas Newport M.D.   On: 11/05/2015 22:01    Labs:  CBC:  Recent Labs  09/03/15 1615 09/10/15 0252 11/05/15 2127 12/02/15 0913  WBC 2.0* 3.4* 2.1* 3.5*  HGB 11.7* 12.8* 10.4* 11.6*  HCT 33.9* 36.7* 30.5* 33.7*  PLT 55* 47* 66* 62*    COAGS:  Recent Labs  12/02/15 1000  INR 1.23    BMP:  Recent Labs  08/15/15 1546 08/16/15 0443 09/10/15 0252 09/10/15 0852 11/05/15 2127  NA 137 137 139  --  134*  K 3.0* 3.4* 2.5* 3.1* 3.4*  CL 99* 100* 106  --  102  CO2 28 26 18*  --  25  GLUCOSE 83 88 117*  --  81  BUN 27* 36* 47*  --  27*  CALCIUM 8.0* 8.1* 8.6*  --  7.7*  CREATININE 4.35* 5.67* 7.68*  --  4.57*  GFRNONAA 14* 10* 7*  --  13*  GFRAA 16* 12* 8*  --  15*    LIVER FUNCTION TESTS:  Recent Labs  07/01/15 1000 07/02/15 0444 08/15/15 1546 11/05/15 2127  BILITOT 0.5 0.8 0.4 0.6  AST 57* 40 75* 50*  ALT 65* 51 91* 41  ALKPHOS 53 46 72 57  PROT 7.2 5.8* 6.5 6.4*  ALBUMIN 3.6 2.9* 3.2* 3.3*    TUMOR MARKERS: No results for input(s): AFPTM, CEA, CA199, CHROMGRNA in the last 8760 hours.  Assessment and Plan:  Richard Herring is a 60 y.o. male with past history significant for chronic atrial fibrillation, cirrhosis, COPD, anxiety and bipolar disorder who presents for CT-guided biopsy of a hypermetabolic left upper lobe pulmonary nodule. The patient is unaccompanied and serves as his own historian.  Risks and Benefits CT-guided pulmonary nodule biopsy were discussed with the patient including, but not limited to bleeding, hemoptysis, respiratory failure requiring intubation, infection, pneumothorax requiring chest tube placement, stroke from air embolism or even death.  All of the patient's questions were answered, patient is agreeable to proceed.  Consent signed and in chart.  Thank you for this interesting consult.  I greatly enjoyed meeting Richard Herring and look forward to participating in  their care.  A copy of this report was sent to the requesting provider on this date.  Electronically Signed: Sandi Mariscal 12/02/2015, 1:13 PM   I spent a total of 15 Minutes in face to face in clinical consultation, greater than 50% of which was counseling/coordinating care for CT-guided left upper  lobe pulmonary nodule biopsy

## 2015-12-02 NOTE — Procedures (Signed)
Technically successful CT guided biopsy of hypermetabolic left upper lobe pulmonary nodule  EBL: None.  No immediate post procedural complications.   Ronny Bacon, MD Pager #: [phone]

## 2015-12-02 NOTE — Discharge Instructions (Signed)

## 2015-12-03 LAB — SURGICAL PATHOLOGY

## 2015-12-03 LAB — PREPARE PLATELET PHERESIS: UNIT DIVISION: 0

## 2015-12-09 ENCOUNTER — Ambulatory Visit
Admission: RE | Admit: 2015-12-09 | Discharge: 2015-12-09 | Disposition: A | Discharge status: Home / Self Care | Payer: Medicaid Other | Source: Ambulatory Visit | Attending: Radiation Oncology | Admitting: Radiation Oncology

## 2015-12-09 ENCOUNTER — Inpatient Hospital Stay (HOSPITAL_BASED_OUTPATIENT_CLINIC_OR_DEPARTMENT_OTHER): Payer: Medicaid Other | Admitting: Oncology

## 2015-12-09 VITALS — BP 124/82 | HR 64 | Temp 95.0°F | Resp 18 | Wt 173.1 lb

## 2015-12-09 DIAGNOSIS — F17210 Nicotine dependence, cigarettes, uncomplicated: Secondary | ICD-10-CM | POA: Diagnosis not present

## 2015-12-09 DIAGNOSIS — R911 Solitary pulmonary nodule: Secondary | ICD-10-CM

## 2015-12-09 DIAGNOSIS — D631 Anemia in chronic kidney disease: Secondary | ICD-10-CM

## 2015-12-09 DIAGNOSIS — I120 Hypertensive chronic kidney disease with stage 5 chronic kidney disease or end stage renal disease: Secondary | ICD-10-CM | POA: Diagnosis not present

## 2015-12-09 DIAGNOSIS — D696 Thrombocytopenia, unspecified: Secondary | ICD-10-CM | POA: Diagnosis not present

## 2015-12-09 DIAGNOSIS — N186 End stage renal disease: Secondary | ICD-10-CM

## 2015-12-09 DIAGNOSIS — Z79899 Other long term (current) drug therapy: Secondary | ICD-10-CM

## 2015-12-09 DIAGNOSIS — Z992 Dependence on renal dialysis: Secondary | ICD-10-CM

## 2015-12-09 MED ORDER — OXYCODONE HCL 10 MG PO TABS: 10.0000 mg | ORAL_TABLET | Freq: Four times a day (QID) | ORAL | 0 refills | Status: DC | PRN

## 2015-12-09 MED ORDER — OXYCODONE HCL 10 MG PO TABS: 10.0000 mg | ORAL_TABLET | Freq: Four times a day (QID) | ORAL | 0 refills | Status: AC | PRN

## 2015-12-09 NOTE — Progress Notes (Signed)
[place]  Telephone:([phone]) [phone] Fax:(336) [phone]  ID: Richard Herring OB: 01-22-1956  MR#: [id number]  TKZ#:[id number]  Patient Care Team: Murlean Iba, MD as PCP - General (Internal Medicine)  CHIEF COMPLAINT: Left upper lobe pulmonary nodule, thrombocytopenia.  INTERVAL HISTORY: Patient returns to clinic today for further evaluation, discussion of his pathology results, and treatment planning.  He continues to have nonspecific pain "all over". He does not complain of weakness or fatigue today. He has no neurologic complaints.  He denies any recent fevers or illnesses.  He denies any easy bleeding or bruising. He has a fair appetite and denies weight loss. He has no chest pain, cough, shortness of breath, or hemoptysis. He denies any nausea, vomiting, constipation, or diarrhea. He has no melena or hematochezia. He has no urinary complaints. Patient offers no further specific complaints today.    REVIEW OF SYSTEMS:   Review of Systems  Constitutional: Negative for fever, malaise/fatigue and weight loss.  Respiratory: Negative.  Negative for cough, hemoptysis and shortness of breath.   Cardiovascular: Negative.  Negative for chest pain.  Gastrointestinal: Positive for abdominal pain. Negative for blood in stool, constipation, diarrhea, melena, nausea and vomiting.  Genitourinary: Negative.   Musculoskeletal: Positive for back pain.  Neurological: Negative for weakness.  Endo/Heme/Allergies: Does not bruise/bleed easily.  Psychiatric/Behavioral: Negative.  The patient is not nervous/anxious.     As per HPI. Otherwise, a complete review of systems is negative.  PAST MEDICAL HISTORY: Past Medical History:  Diagnosis Date  . Anemia of chronic disease   . Anxiety   . Arthritis   . Asthma   . Bipolar disorder ([place])   . Cancer ([place])    left lung nodule  . Chronic atrial fibrillation (HCC)    a. not on long term full-dose anticoagulaiton 2/2 thrombocytopenia;  CHADS2VASc 0 based on available data  . Cirrhosis ([place])   . COPD (chronic obstructive pulmonary disease) ([place])   . Depression   . Dyspnea   . ESRD on dialysis ([place])   . GERD (gastroesophageal reflux disease)   . Gout   . Headache   . Hepatitis C   . Hypertension   . Incarceration   . Renal insufficiency    Pt takes Dialysis Tues, Thursday and Saturday.   . Schizophrenia ([place])   . Stomach ulcer   . Thrombocytopenia ([place])     PAST SURGICAL HISTORY: Past Surgical History:  Procedure Laterality Date  . CARDIAC DEFIBRILLATOR PLACEMENT  2015  . DIALYSIS FISTULA CREATION  2014  . PERIPHERAL VASCULAR CATHETERIZATION Left 08/21/2015   Procedure: A/V Shuntogram/Fistulagram;  Surgeon: Katha Cabal, MD;  Location: [facility];  Service: Cardiovascular;  Laterality: Left;  . PERIPHERAL VASCULAR CATHETERIZATION N/A 08/21/2015   Procedure: A/V Shunt Intervention;  Surgeon: Katha Cabal, MD;  Location: [facility];  Service: Cardiovascular;  Laterality: N/A;  . PERITONEAL WOUND DRAINAGE  2015,2016   gangreen      FAMILY HISTORY: Family History  Problem Relation Age of Onset  . Kidney cancer Neg Hx   . Prostate cancer Neg Hx        ADVANCED DIRECTIVES:    HEALTH MAINTENANCE: Social History  Substance Use Topics  . Smoking status: Current Every Day Smoker    Packs/day: 1.50    Years: 30.00    Types: Cigarettes  . Smokeless tobacco: Never Used     Comment: pt reports smoke "alot"  . Alcohol use 0.0 oz/week  Comment: drinks beer     Colonoscopy:  PAP:  Bone density:  Lipid panel:  No Known Allergies  Current Outpatient Prescriptions  Medication Sig Dispense Refill  . albuterol (PROVENTIL HFA;VENTOLIN HFA) 108 (90 Base) MCG/ACT inhaler Inhale 2 puffs into the lungs 4 (four) times daily as needed for wheezing or shortness of breath. 1 Inhaler 0  . aspirin EC 81 MG tablet Take 81 mg by mouth daily. Reported on 08/06/2015    . buPROPion  (WELLBUTRIN SR) 150 MG 12 hr tablet Take 150 mg by mouth daily.    . calcium carbonate (TUMS - DOSED IN MG ELEMENTAL CALCIUM) 500 MG chewable tablet Chew 2 tablets by mouth 4 (four) times daily as needed for indigestion or heartburn.    . folic acid (FOLVITE) 1 MG tablet Take 1 mg by mouth.    . hydrocortisone 2.5 % cream Apply 1 application topically 2 (two) times daily.    . hydrOXYzine (ATARAX/VISTARIL) 25 MG tablet Take 25 mg by mouth every 6 (six) hours as needed for anxiety.    Marland Kitchen LORazepam (ATIVAN) 0.5 MG tablet Take 0.5 mg by mouth 2 (two) times daily as needed for anxiety.    . ondansetron (ZOFRAN) 4 MG tablet Take 4 mg by mouth every 8 (eight) hours as needed for nausea or vomiting.    . pramoxine-hydrocortisone 1-1 % foam Apply 1 applicator topically 3 (three) times daily.    . sevelamer carbonate (RENVELA) 800 MG tablet Take 1 tablet (800 mg total) by mouth 3 (three) times daily with meals. 90 tablet 0  . vitamin B-12 (CYANOCOBALAMIN) 1000 MCG tablet Take 1,000 mcg by mouth daily.    . vitamin C (ASCORBIC ACID) 500 MG tablet Take 500 mg by mouth daily.    . Oxycodone HCl 10 MG TABS Take 1 tablet (10 mg total) by mouth every 6 (six) hours as needed. 30 tablet 0   No current facility-administered medications for this visit.     OBJECTIVE: Vitals:   12/09/15 0859  BP: 124/82  Pulse: 64  Resp: 18  Temp: (!) 95 F (35 C)     Body mass index is 24.83 kg/m.    ECOG FS:0 - Asymptomatic  General: Well-developed, well-nourished, no acute distress. Eyes: Pink conjunctiva, anicteric sclera. Lungs: Clear to auscultation bilaterally. Heart: Regular rate and rhythm. No rubs, murmurs, or gallops. Abdomen: Soft, nontender, nondistended. No organomegaly noted, normoactive bowel sounds. Musculoskeletal: No edema, cyanosis, or clubbing. Dialysis Catheter in right chest wall, surgery to repair fistula and upper left arm. Neuro: Alert, answering all questions appropriately. Cranial nerves  grossly intact. Skin: No rashes or petechiae noted. Psych: Normal affect.  LAB RESULTS:  Lab Results  Component Value Date   NA 134 (L) 11/05/2015   K 3.4 (L) 11/05/2015   CL 102 11/05/2015   CO2 25 11/05/2015   GLUCOSE 81 11/05/2015   BUN 27 (H) 11/05/2015   CREATININE 4.57 (H) 11/05/2015   CALCIUM 7.7 (L) 11/05/2015   PROT 6.4 (L) 11/05/2015   ALBUMIN 3.3 (L) 11/05/2015   AST 50 (H) 11/05/2015   ALT 41 11/05/2015   ALKPHOS 57 11/05/2015   BILITOT 0.6 11/05/2015   GFRNONAA 13 (L) 11/05/2015   GFRAA 15 (L) 11/05/2015    Lab Results  Component Value Date   WBC 3.5 (L) 12/02/2015   NEUTROABS 2.7 12/02/2015   HGB 11.6 (L) 12/02/2015   HCT 33.7 (L) 12/02/2015   MCV 97.0 12/02/2015   PLT 62 (L) 12/02/2015  STUDIES: Dg Chest 1 View  Result Date: 12/02/2015 CLINICAL DATA:  Post left upper lobe pulmonary nodule biopsy EXAM: CHEST 1 VIEW COMPARISON:  11/05/2015; chest CT - 10/07/2015; PET-CT -11/06/2015; CT-guided left upper lobe pulmonary nodule biopsy - earlier same day FINDINGS: Unchanged cardiac silhouette and mediastinal contours. Stable position of support apparatus. There is a minimal amount of ill-defined ground-glass surrounding the known hypermetabolic left upper lobe pulmonary nodule compatible with a minimal amount of peribiopsy hemorrhage. No pneumothorax. No pleural effusion. No focal airspace opacities. No evidence of edema. No acute osseus abnormalities. IMPRESSION: No evidence of complication following left upper lobe pulmonary nodule biopsy. Specifically, no evidence of pneumothorax. Electronically Signed   By: Sandi Mariscal M.D.   On: 12/02/2015 15:08   Ct Biopsy  Result Date: 12/02/2015 INDICATION: Indeterminate hypermetabolic left upper lobe pulmonary nodule. Please perform CT-guided biopsy for tissue diagnostic purposes. EXAM: CT GUIDED BIOPSY OF HYPERMETABOLIC LEFT UPPER LOBE PULMONARY NODULE COMPARISON:  Chest CT - 10/07/2015; PET-CT - 10/27/2015  MEDICATIONS: None. ANESTHESIA/SEDATION: Fentanyl 50 mcg IV; Versed 2 mg IV Sedation time: 20 minutes; The patient was continuously monitored during the procedure by the interventional radiology nurse under my direct supervision. CONTRAST:  None COMPLICATIONS: None immediate. PROCEDURE: Informed consent was obtained from the patient following an explanation of the procedure, risks, benefits and alternatives. The patient understands,agrees and consents for the procedure. All questions were addressed. A time out was performed prior to the initiation of the procedure. The patient was positioned supine, slightly RPO on the CT table and a limited chest CT was performed for procedural planning demonstrating unchanged size and appearance of the slightly spiculated approximately 2.0 x 1.5 cm macro lobulated nodule within in the left upper lobe (image 7, series 4. The operative site was prepped and draped in the usual sterile fashion. Under sterile conditions and local anesthesia, a 17 gauge coaxial needle was advanced into the peripheral aspect of the nodule (image 20, series 5). Positioning was confirmed with intermittent CT fluoroscopy and followed by the acquisition of 4 core needle biopsy samples with an 18 gauge core needle biopsy device. The coaxial needle was removed following the administration of a blood patch and superficial hemostasis was achieved with manual compression. Limited post procedural chest CT was negative for pneumothorax or additional complication. A dressing was placed. The patient tolerated the procedure well without immediate postprocedural complication. The patient was escorted to have an upright chest radiograph. IMPRESSION: Technically successful CT guided core needle core biopsy of hypermetabolic left upper lobe pulmonary nodule. Electronically Signed   By: Sandi Mariscal M.D.   On: 12/02/2015 15:43    ASSESSMENT: Left upper lobe pulmonary nodule, thrombocytopenia.  PLAN:    1. Left upper  lobe pulmonary nodule: Pathology results reviewed independently. Although no invasive component was identified, possibly secondary to sampling error, this is still highly concerning for malignancy. Given patient's multiple comorbidities and thrombocytopenia, he is not a surgical candidate. He had consultation with radiation oncology for consideration of SBRT. Return to clinic in 2 months after the conclusion of his XRT for further evaluation.  2. Pancytopenia: Mild. All of patient's other lab work is either negative or within normal limits. Likely multifactorial including dialysis, cirrhosis and splenomegaly.  Patient does not require a bone marrow biopsy at this time.  No intervention is needed. 3. Thrombocytopenia: Decreased but stable. Secondary to cirrhosis, splenomegaly, and positive platelet antibodies. Although these antibodies can be transient in nature.  4.  Renal failure: Continue dialysis as  indicated. 5.  Hypokalemia: Replacement as needed with dialysis.  6.  Anemia: Case discussed with nephrology and will hold Procrit at this time. 7. Pain: Patient was given a prescription for oxycodone today, but instructed he will no longer be receiving narcotics from this clinic.  Patient expressed understanding and was in agreement with this plan. He also understands that He can call clinic at any time with any questions, concerns, or complaints.    Lloyd Huger, MD   12/13/2015 8:08 AM

## 2015-12-09 NOTE — Progress Notes (Signed)
States is having pain in left side of chest and abdomen today. For the past 3 months, has had nausea, vomiting, and diarrhea. Requests prescription for pain medication.

## 2015-12-09 NOTE — Progress Notes (Signed)
Radiation Oncology Follow up Note  Name: Richard Herring   Date:   12/09/2015 MRN:  [id number] DOB: [birth year]    This 60 y.o. 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  REFERRING PROVIDER: Murlean Iba, MD  HPI: Patient is a 60 year old male originally consult back in September 2017. He has multiple comorbidities including end-stage renal disease on dialysis liver cirrhosis pancytopenia hepatitis C and COPD was found to have a solitary left upper lobe nodule. This was PET positive. Patient recently had after platelet transfusion a CT-guided biopsy. Showing adenocarcinoma lipidic pattern. Case was again reviewed at our weekly tumor conference this was thought to be most likely a low-grade adenocarcinoma and he is seen today for consideration of SB RT treatment. He is doing fairly well specifically denies cough hemoptysis or chest tightness.  COMPLICATIONS OF TREATMENT: none  FOLLOW UP COMPLIANCE: keeps appointments   PHYSICAL EXAM:  There were no vitals taken for this visit. Well-developed well-nourished patient in NAD. HEENT reveals PERLA, EOMI, discs not visualized.  Oral cavity is clear. No oral mucosal lesions are identified. Neck is clear without evidence of cervical or supraclavicular adenopathy. Lungs are clear to A&P. Cardiac examination is essentially unremarkable with regular rate and rhythm without murmur rub or thrill. Abdomen is benign with no organomegaly or masses noted. Motor sensory and DTR levels are equal and symmetric in the upper and lower extremities. Cranial nerves II through XII are grossly intact. Proprioception is intact. No peripheral adenopathy or edema is identified. No motor or sensory levels are noted. Crude visual fields are within normal range.  RADIOLOGY RESULTS: PET CT scan is again reviewed  PLAN: At this time like to go ahead with SB RT to his left upper lobe. Would plan a 5000 cGy over 5  fractions. Risks and benefits of treatment including possible development of cough fatigue skin reaction possible slight chance of radiation esophagitis all were discussed in detail with the patient. He seems to comprehend my treatment plan well. I have set him up for CT simulation early next week.  I would like to take this opportunity to thank you for allowing me to participate in the care of your patient.Armstead Peaks., MD

## 2015-12-15 ENCOUNTER — Ambulatory Visit: Payer: Medicaid Other

## 2015-12-16 ENCOUNTER — Ambulatory Visit
Admission: RE | Admit: 2015-12-16 | Discharge: 2015-12-16 | Disposition: A | Discharge status: Home / Self Care | Payer: Medicaid Other | Source: Ambulatory Visit | Attending: Radiation Oncology | Admitting: Radiation Oncology

## 2015-12-16 DIAGNOSIS — Z510 Encounter for antineoplastic radiation therapy: Secondary | ICD-10-CM | POA: Diagnosis not present

## 2015-12-22 ENCOUNTER — Ambulatory Visit: Payer: Medicaid Other

## 2015-12-22 DIAGNOSIS — Z510 Encounter for antineoplastic radiation therapy: Secondary | ICD-10-CM | POA: Diagnosis not present

## 2015-12-24 ENCOUNTER — Ambulatory Visit: Payer: Medicaid Other

## 2015-12-25 DIAGNOSIS — Z510 Encounter for antineoplastic radiation therapy: Secondary | ICD-10-CM | POA: Diagnosis not present

## 2015-12-29 ENCOUNTER — Ambulatory Visit
Admission: RE | Admit: 2015-12-29 | Discharge: 2015-12-29 | Disposition: A | Discharge status: Home / Self Care | Payer: Medicaid Other | Source: Ambulatory Visit | Attending: Radiation Oncology | Admitting: Radiation Oncology

## 2015-12-29 DIAGNOSIS — Z510 Encounter for antineoplastic radiation therapy: Secondary | ICD-10-CM | POA: Diagnosis not present

## 2015-12-31 ENCOUNTER — Ambulatory Visit: Payer: Medicaid Other

## 2015-12-31 ENCOUNTER — Telehealth: Payer: Self-pay | Admitting: *Deleted

## 2015-12-31 ENCOUNTER — Other Ambulatory Visit: Payer: Self-pay | Admitting: *Deleted

## 2015-12-31 NOTE — Telephone Encounter (Signed)
-----   Message from Daiva Huge, RN sent at 12/31/2015  9:47 AM EST ----- Regarding: Pain medication Richard Herring is requesting a refill on his oxycodone that Dr. Grayland Ormond prescribed.  If you could please call him at [phone] and let him know if it will be ordered.   Thanks,   [facility]

## 2015-12-31 NOTE — Telephone Encounter (Signed)
Call returned to patient to inform him Dr. Grayland Ormond would not be able to prescribe pain medication for him as discussed at his last appointment. Patient states he is having worsening chest pain, shortness of breath and "feels like my liver is messed up really bad". Patient advised he should be seen in the ER for evaluation. Patient states he is going to dialysis, will let the doctor there know about his symptoms.

## 2016-01-04 ENCOUNTER — Telehealth: Payer: Self-pay | Admitting: *Deleted

## 2016-01-04 NOTE — Telephone Encounter (Signed)
Patient called to notify [facility] that he has moved to [place] and will no longer be coming to the [facility] for treatment. He would like a referral from Dr. Baruch Gouty for treatment in [place]. His new address is [address] in [place]. His phone is [phone] and his brother's phone is [phone] Chrissie Noa)

## 2016-01-05 ENCOUNTER — Ambulatory Visit: Payer: Medicaid Other

## 2016-01-07 ENCOUNTER — Ambulatory Visit: Payer: Medicaid Other

## 2016-01-12 ENCOUNTER — Ambulatory Visit: Payer: Medicaid Other

## 2016-01-19 ENCOUNTER — Ambulatory Visit: Payer: Medicaid Other

## 2016-02-08 NOTE — Progress Notes (Deleted)
[facility]  [phone]) [phone] [phone]) [phone]  ID: Richard Herring OB: 06-04-1955  MR#: [id number]  KBT#:[id number]  Patient Care Team: Murlean Iba, MD as PCP - General (Internal Medicine)  CHIEF COMPLAINT: Left upper lobe pulmonary nodule, thrombocytopenia.  INTERVAL HISTORY: Patient returns to clinic today for further evaluation, discussion of his pathology results, and treatment planning.  He continues to have nonspecific pain "all over". He does not complain of weakness or fatigue today. He has no neurologic complaints.  He denies any recent fevers or illnesses.  He denies any easy bleeding or bruising. He has a fair appetite and denies weight loss. He has no chest pain, cough, shortness of breath, or hemoptysis. He denies any nausea, vomiting, constipation, or diarrhea. He has no melena or hematochezia. He has no urinary complaints. Patient offers no further specific complaints today.    REVIEW OF SYSTEMS:   Review of Systems  Constitutional: Negative for fever, malaise/fatigue and weight loss.  Respiratory: Negative.  Negative for cough, hemoptysis and shortness of breath.   Cardiovascular: Negative.  Negative for chest pain.  Gastrointestinal: Positive for abdominal pain. Negative for blood in stool, constipation, diarrhea, melena, nausea and vomiting.  Genitourinary: Negative.   Musculoskeletal: Positive for back pain.  Neurological: Negative for weakness.  Endo/Heme/Allergies: Does not bruise/bleed easily.  Psychiatric/Behavioral: Negative.  The patient is not nervous/anxious.     As per HPI. Otherwise, a complete review of systems is negative.  PAST MEDICAL HISTORY: Past Medical History:  Diagnosis Date  . Anemia of chronic disease   . Anxiety   . Arthritis   . Asthma   . Bipolar disorder ([place])   . Cancer ([place])    left lung nodule  . Chronic atrial fibrillation (H[facility])    a. not on long term full-dose anticoagulaiton 2/2 thrombocytopenia;  CHADS2VASc 0 based on available data  . Cirrhosis ([place])   . COPD (chronic obstructive pulmonary disease) ([place])   . Depression   . Dyspnea   . ESRD on dialysis ([place])   . GERD (gastroesophageal reflux disease)   . Gout   . Headache   . Hepatitis C   . Hypertension   . Incarceration   . Renal insufficiency    Pt takes Dialysis Tues, Thursday and Saturday.   . Schizophrenia ([place])   . Stomach ulcer   . Thrombocytopenia ([place])     PAST SURGICAL HISTORY: Past Surgical History:  Procedure Laterality Date  . CARDIAC DEFIBRILLATOR PLACEMENT  2015  . DIALYSIS FISTULA CREATION  2014  . PERIPHERAL VASCULAR CATHETERIZATION Left 08/21/2015   Procedure: A/V Shuntogram/Fistulagram;  Surgeon: Katha Cabal, MD;  Location: [facility];  Service: Cardiovascular;  Laterality: Left;  . PERIPHERAL VASCULAR CATHETERIZATION N/A 08/21/2015   Procedure: A/V Shunt Intervention;  Surgeon: Katha Cabal, MD;  Location: [facility];  Service: Cardiovascular;  Laterality: N/A;  . PERITONEAL WOUND DRAINAGE  2015,2016   [place]      FAMILY HISTORY: Family History  Problem Relation Age of Onset  . Kidney cancer Neg Hx   . Prostate cancer Neg Hx        ADVANCED DIRECTIVES:    HEALTH MAINTENANCE: Social History  Substance Use Topics  . Smoking status: Current Every Day Smoker    Packs/day: 1.50    Years: 30.00    Types: Cigarettes  . Smokeless tobacco: Never Used     Comment: pt reports smoke "alot"  . Alcohol use 0.0 oz/week  Comment: drinks beer     Colonoscopy:  PAP:  Bone density:  Lipid panel:  No Known Allergies  Current Outpatient Prescriptions  Medication Sig Dispense Refill  . albuterol (PROVENTIL HFA;VENTOLIN HFA) 108 (90 Base) MCG/ACT inhaler Inhale 2 puffs into the lungs 4 (four) times daily as needed for wheezing or shortness of breath. 1 Inhaler 0  . aspirin EC 81 MG tablet Take 81 mg by mouth daily. Reported on 08/06/2015    . buPROPion  (WELLBUTRIN SR) 150 MG 12 hr tablet Take 150 mg by mouth daily.    . calcium carbonate (TUMS - DOSED IN MG ELEMENTAL CALCIUM) 500 MG chewable tablet Chew 2 tablets by mouth 4 (four) times daily as needed for indigestion or heartburn.    . folic acid (FOLVITE) 1 MG tablet Take 1 mg by mouth.    . hydrocortisone 2.5 % cream Apply 1 application topically 2 (two) times daily.    . hydrOXYzine (ATARAX/VISTARIL) 25 MG tablet Take 25 mg by mouth every 6 (six) hours as needed for anxiety.    Marland Kitchen LORazepam (ATIVAN) 0.5 MG tablet Take 0.5 mg by mouth 2 (two) times daily as needed for anxiety.    . ondansetron (ZOFRAN) 4 MG tablet Take 4 mg by mouth every 8 (eight) hours as needed for nausea or vomiting.    . Oxycodone HCl 10 MG TABS Take 1 tablet (10 mg total) by mouth every 6 (six) hours as needed. 30 tablet 0  . pramoxine-hydrocortisone 1-1 % foam Apply 1 applicator topically 3 (three) times daily.    . sevelamer carbonate (RENVELA) 800 MG tablet Take 1 tablet (800 mg total) by mouth 3 (three) times daily with meals. 90 tablet 0  . vitamin B-12 (CYANOCOBALAMIN) 1000 MCG tablet Take 1,000 mcg by mouth daily.    . vitamin C (ASCORBIC ACID) 500 MG tablet Take 500 mg by mouth daily.     No current facility-administered medications for this visit.     OBJECTIVE: There were no vitals filed for this visit.   There is no height or weight on file to calculate BMI.    ECOG FS:0 - Asymptomatic  General: Well-developed, well-nourished, no acute distress. Eyes: Pink conjunctiva, anicteric sclera. Lungs: Clear to auscultation bilaterally. Heart: Regular rate and rhythm. No rubs, murmurs, or gallops. Abdomen: Soft, nontender, nondistended. No organomegaly noted, normoactive bowel sounds. Musculoskeletal: No edema, cyanosis, or clubbing. Dialysis Catheter in right chest wall, surgery to repair fistula and upper left arm. Neuro: Alert, answering all questions appropriately. Cranial nerves grossly intact. Skin: No  rashes or petechiae noted. Psych: Normal affect.  LAB RESULTS:  Lab Results  Component Value Date   NA 134 (L) 11/05/2015   K 3.4 (L) 11/05/2015   CL 102 11/05/2015   CO2 25 11/05/2015   GLUCOSE 81 11/05/2015   BUN 27 (H) 11/05/2015   CREATININE 4.57 (H) 11/05/2015   CALCIUM 7.7 (L) 11/05/2015   PROT 6.4 (L) 11/05/2015   ALBUMIN 3.3 (L) 11/05/2015   AST 50 (H) 11/05/2015   ALT 41 11/05/2015   ALKPHOS 57 11/05/2015   BILITOT 0.6 11/05/2015   GFRNONAA 13 (L) 11/05/2015   GFRAA 15 (L) 11/05/2015    Lab Results  Component Value Date   WBC 3.5 (L) 12/02/2015   NEUTROABS 2.7 12/02/2015   HGB 11.6 (L) 12/02/2015   HCT 33.7 (L) 12/02/2015   MCV 97.0 12/02/2015   PLT 62 (L) 12/02/2015     STUDIES: No results found.  ASSESSMENT: Left  upper lobe pulmonary nodule, thrombocytopenia.  PLAN:    1. Left upper lobe pulmonary nodule: Pathology results reviewed independently. Although no invasive component was identified, possibly secondary to sampling error, this is still highly concerning for malignancy. Given patient's multiple comorbidities and thrombocytopenia, he is not a surgical candidate. He had consultation with radiation oncology for consideration of SBRT. Return to clinic in 2 months after the conclusion of his XRT for further evaluation.  2. Pancytopenia: Mild. All of patient's other lab work is either negative or within normal limits. Likely multifactorial including dialysis, cirrhosis and splenomegaly.  Patient does not require a bone marrow biopsy at this time.  No intervention is needed. 3. Thrombocytopenia: Decreased but stable. Secondary to cirrhosis, splenomegaly, and positive platelet antibodies. Although these antibodies can be transient in nature.  4.  Renal failure: Continue dialysis as indicated. 5.  Hypokalemia: Replacement as needed with dialysis.  6.  Anemia: Case discussed with nephrology and will hold Procrit at this time. 7. Pain: Patient was given a  prescription for oxycodone today, but instructed he will no longer be receiving narcotics from this clinic.  Patient expressed understanding and was in agreement with this plan. He also understands that He can call clinic at any time with any questions, concerns, or complaints.    Lloyd Huger, MD   02/08/2016 10:22 PM

## 2016-02-10 ENCOUNTER — Inpatient Hospital Stay: Payer: Medicaid Other

## 2016-02-10 ENCOUNTER — Inpatient Hospital Stay: Payer: Medicaid Other | Admitting: Oncology

## 2016-02-22 DEATH — deceased

## 2018-01-19 IMAGING — CT CT ABD-PELV W/O CM
2 of 4 series · 15 of 46 positions shown, 17 images · non-contrast
Comparison: None

CLINICAL DATA: Lower abdominal pain with nausea, vomiting and
diarrhea for 2 days, history COPD, CHF, MI, asthma, atrial
fibrillation, smoker, liver disease

EXAM:
CT ABDOMEN AND PELVIS WITHOUT CONTRAST
TECHNIQUE: Multidetector CT imaging of the abdomen and pelvis was performed
following the standard protocol without IV contrast. Sagittal and
coronal MPR images reconstructed from axial data set. Patient drank
dilute oral contrast for exam.

[Series 2: routine abd pel wo · axial · 0.77mm/px · z∈[-501,-101]mm · 12 of 88 slices shown, 14 images]
[im 4/88  soft-tissue]
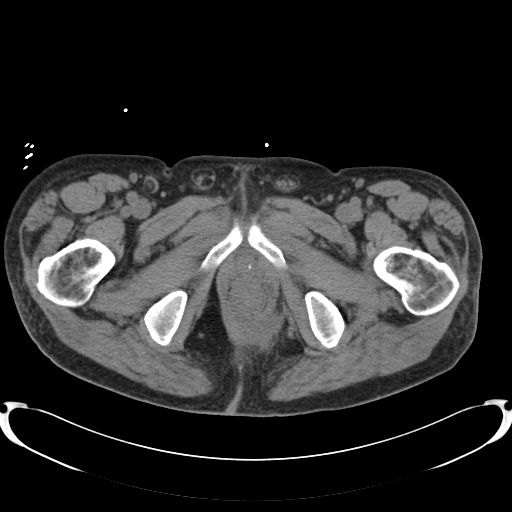
[im 4/88  bone]
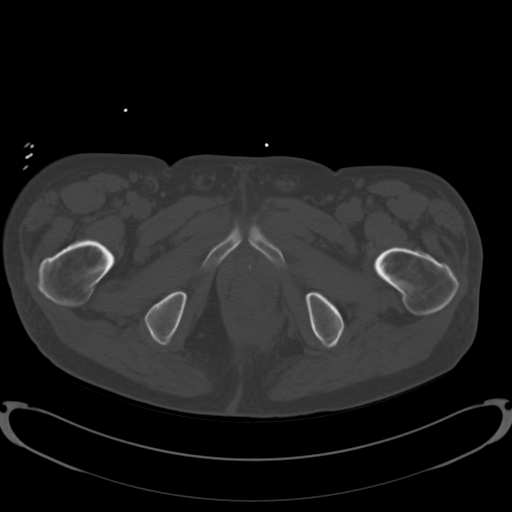
[im 11/88  soft-tissue]
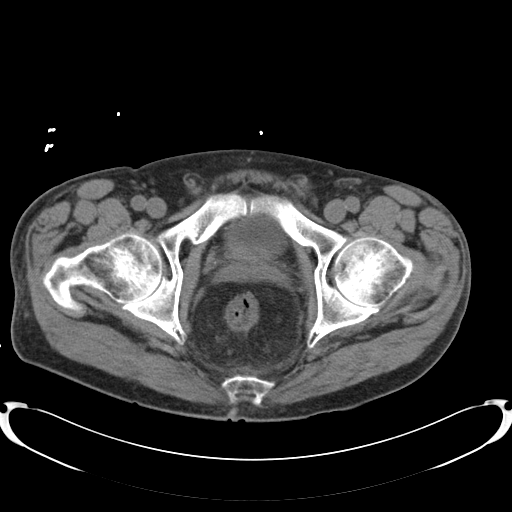
[im 19/88  soft-tissue]
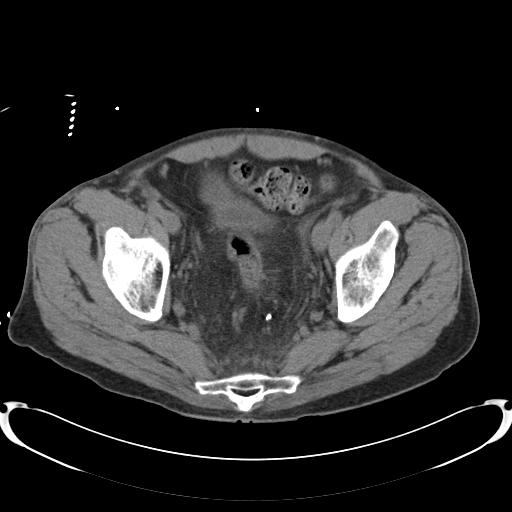
[im 26/88  soft-tissue]
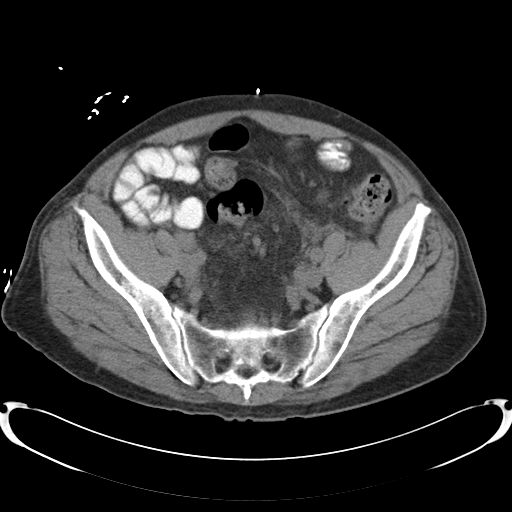
[im 33/88  soft-tissue]
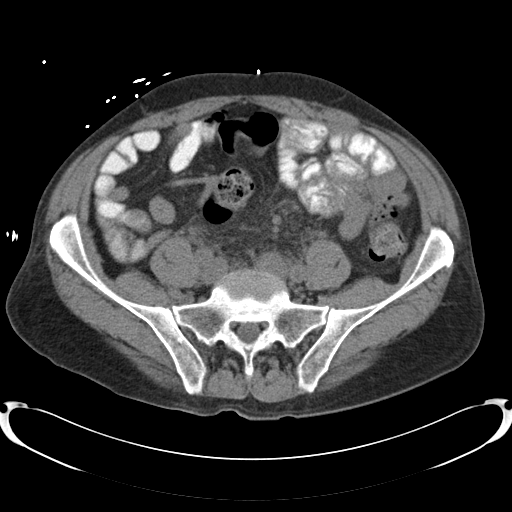
[im 40/88  soft-tissue]
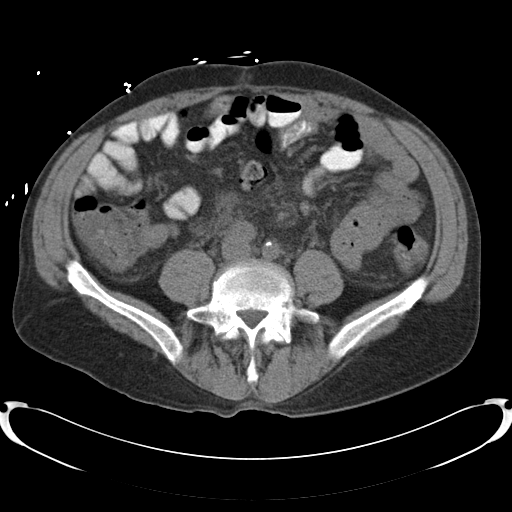
[im 48/88  soft-tissue]
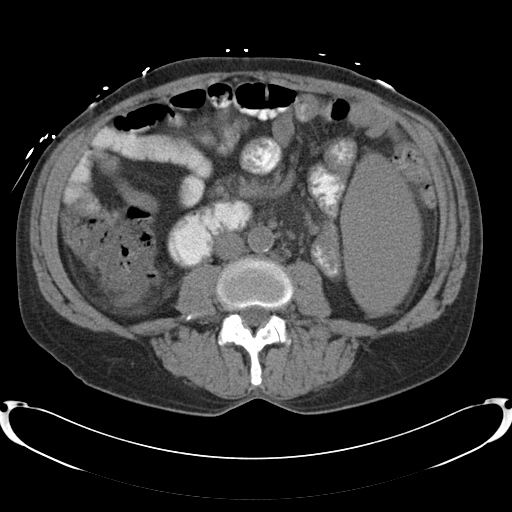
[im 55/88  soft-tissue]
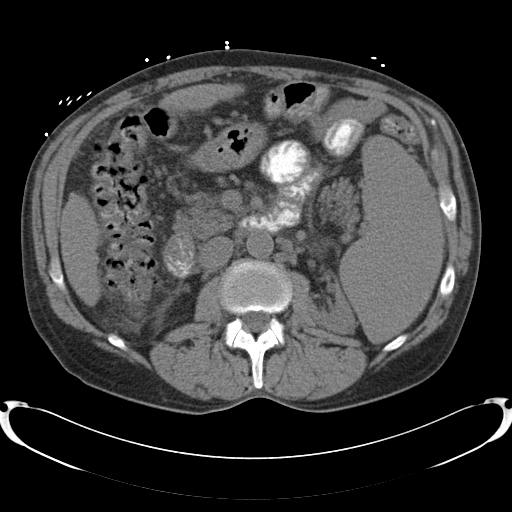
[im 62/88  soft-tissue]
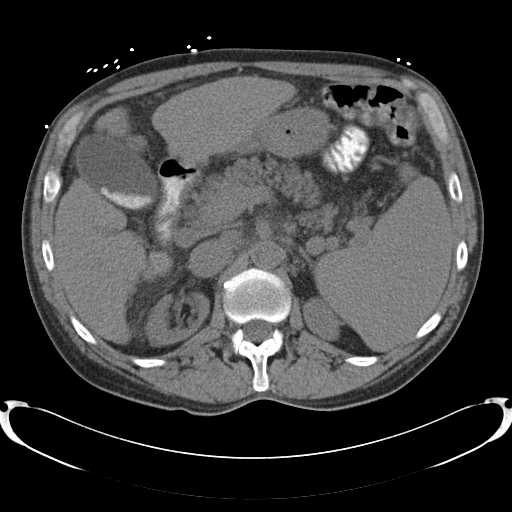
[im 62/88  bone]
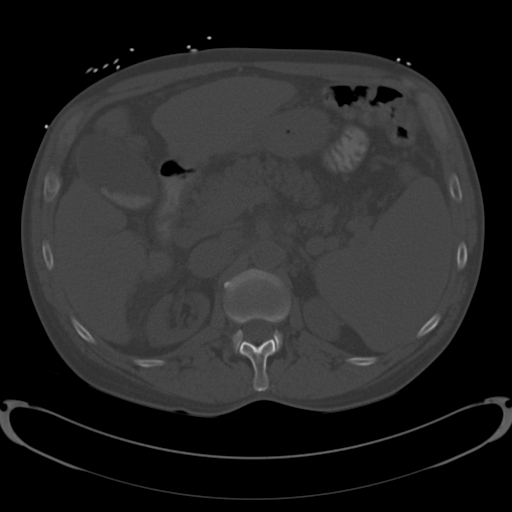
[im 69/88  soft-tissue]
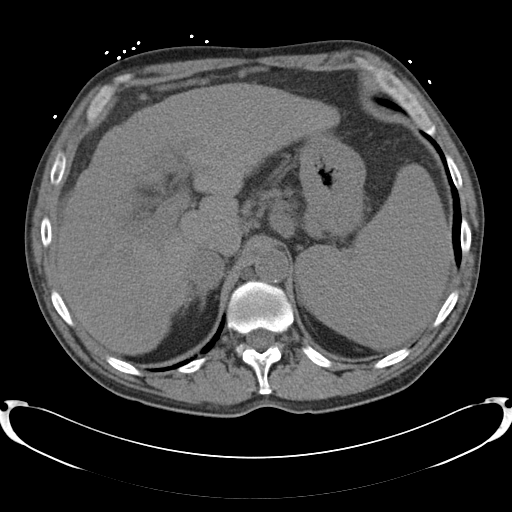
[im 77/88  soft-tissue]
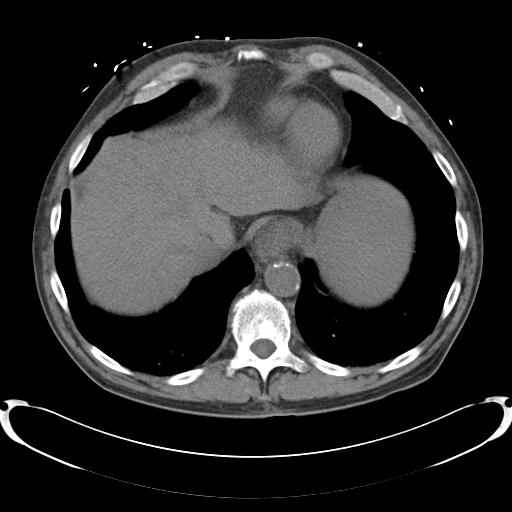
[im 84/88  soft-tissue]
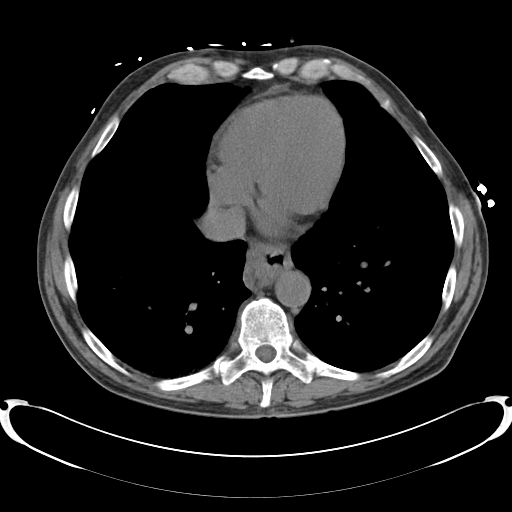

[Series 5: cor routine abd pel wo · coronal · 0.58mm/px · 3 of 138 slices shown]
[im 46/138  soft-tissue]
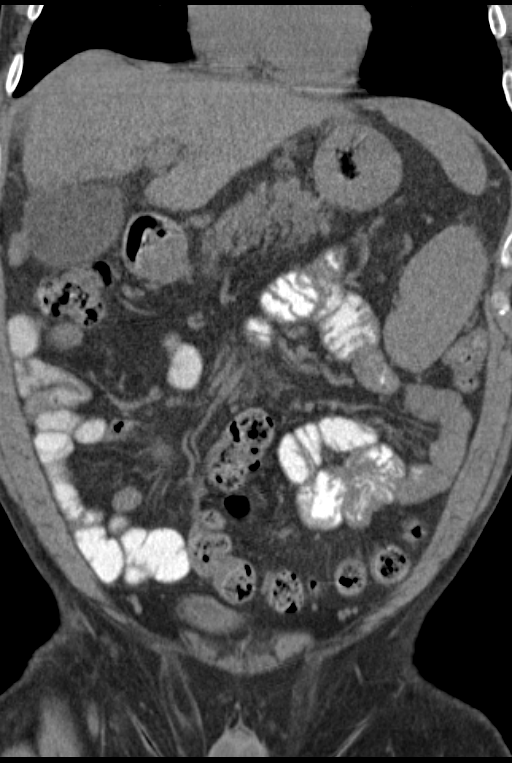
[im 61/138  soft-tissue]
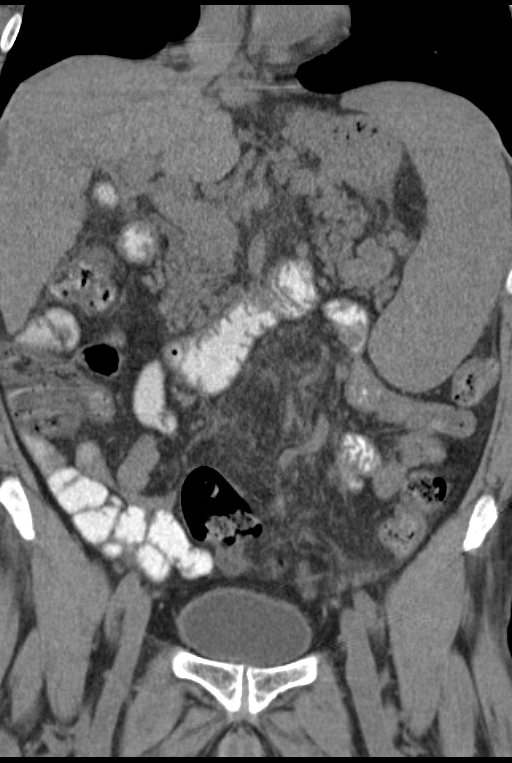
[im 77/138  soft-tissue]
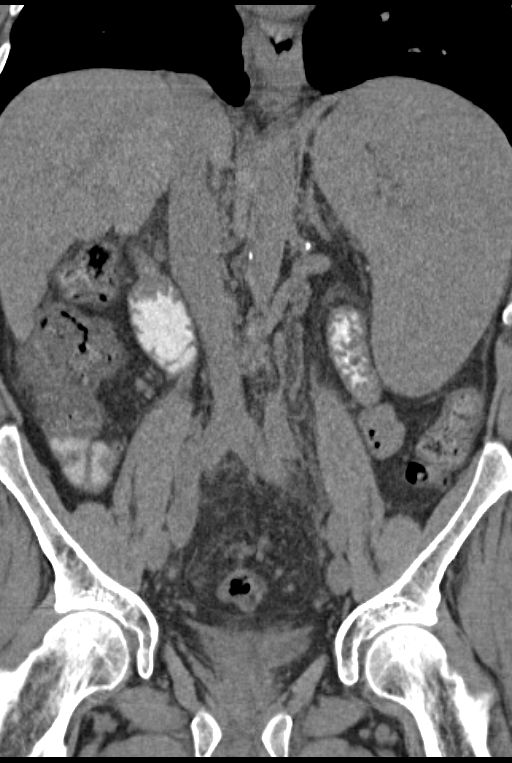

[15 of 46 positions shown; findings below may reference images not displayed]

FINDINGS: Lower chest:  Dependent bibasilar atelectasis.

Hepatobiliary: Calcified gallstones within gallbladder. Nodular
cirrhotic appearing liver. No focal hepatic mass or definite biliary
dilatation.

Pancreas: Normal appearance

Spleen: Enlarged, 17.6 cm length with calculated volume 8840 mL. No
focal abnormalities.

Adrenals/Urinary Tract: Adrenal glands normal appearance. BILATERAL
atrophic kidneys. No gross evidence of renal mass, hydronephrosis,
hydroureter, or urinary tract calcification. Unremarkable bladder.
Minimal prostatic enlargement.

Stomach/Bowel: Normal appendix. Scattered stool throughout colon.
Stomach decompressed with suboptimal assessment of wall thickness.
Small bowel loops unremarkable. No bowel dilatation, bowel wall
thickening or evidence of obstruction. Small hiatal hernia with
questionable wall thickening of distal esophagus, unable to exclude
mass. Suspect paraesophageal varices.

Vascular/Lymphatic: Scattered atherosclerotic calcification aorta.
No adenopathy.

Reproductive: N/A

Other: No free air or free fluid. No hernia. Diffuse infiltrative
change/edema involving sigmoid mesocolon, small bowel mesentery,
pelvic soft tissue planes, and celiac axis. This is nonspecific but
could reflect a combination of portal hypertension and
hypoproteinemia as a result of cirrhosis. No discrete abscess or
source of an acute inflammatory process is identified.

Musculoskeletal: No acute osseous findings.
IMPRESSION: Probable cirrhotic liver with splenomegaly and suspected
paraesophageal varices.

Cannot exclude esophageal wall thickening near a visualize small
hiatal hernia; endoscopic follow-up recommended to exclude tumor.

Scattered edema within sigmoid mesocolon, pelvic soft tissue planes,
small bowel mesenteries celiac axis question reflecting portal
hypertension and potentially hypoproteinemia.

Cholelithiasis.

BILATERAL renal cortical atrophy.

## 2018-04-11 IMAGING — CR DG CHEST 2V
2 series · 2 of 2 positions shown · non-contrast
Comparison: 09/10/2015, 10/27/2015

CLINICAL DATA: Weakness and dyspnea today

EXAM:
CHEST  2 VIEW

[chest pa]
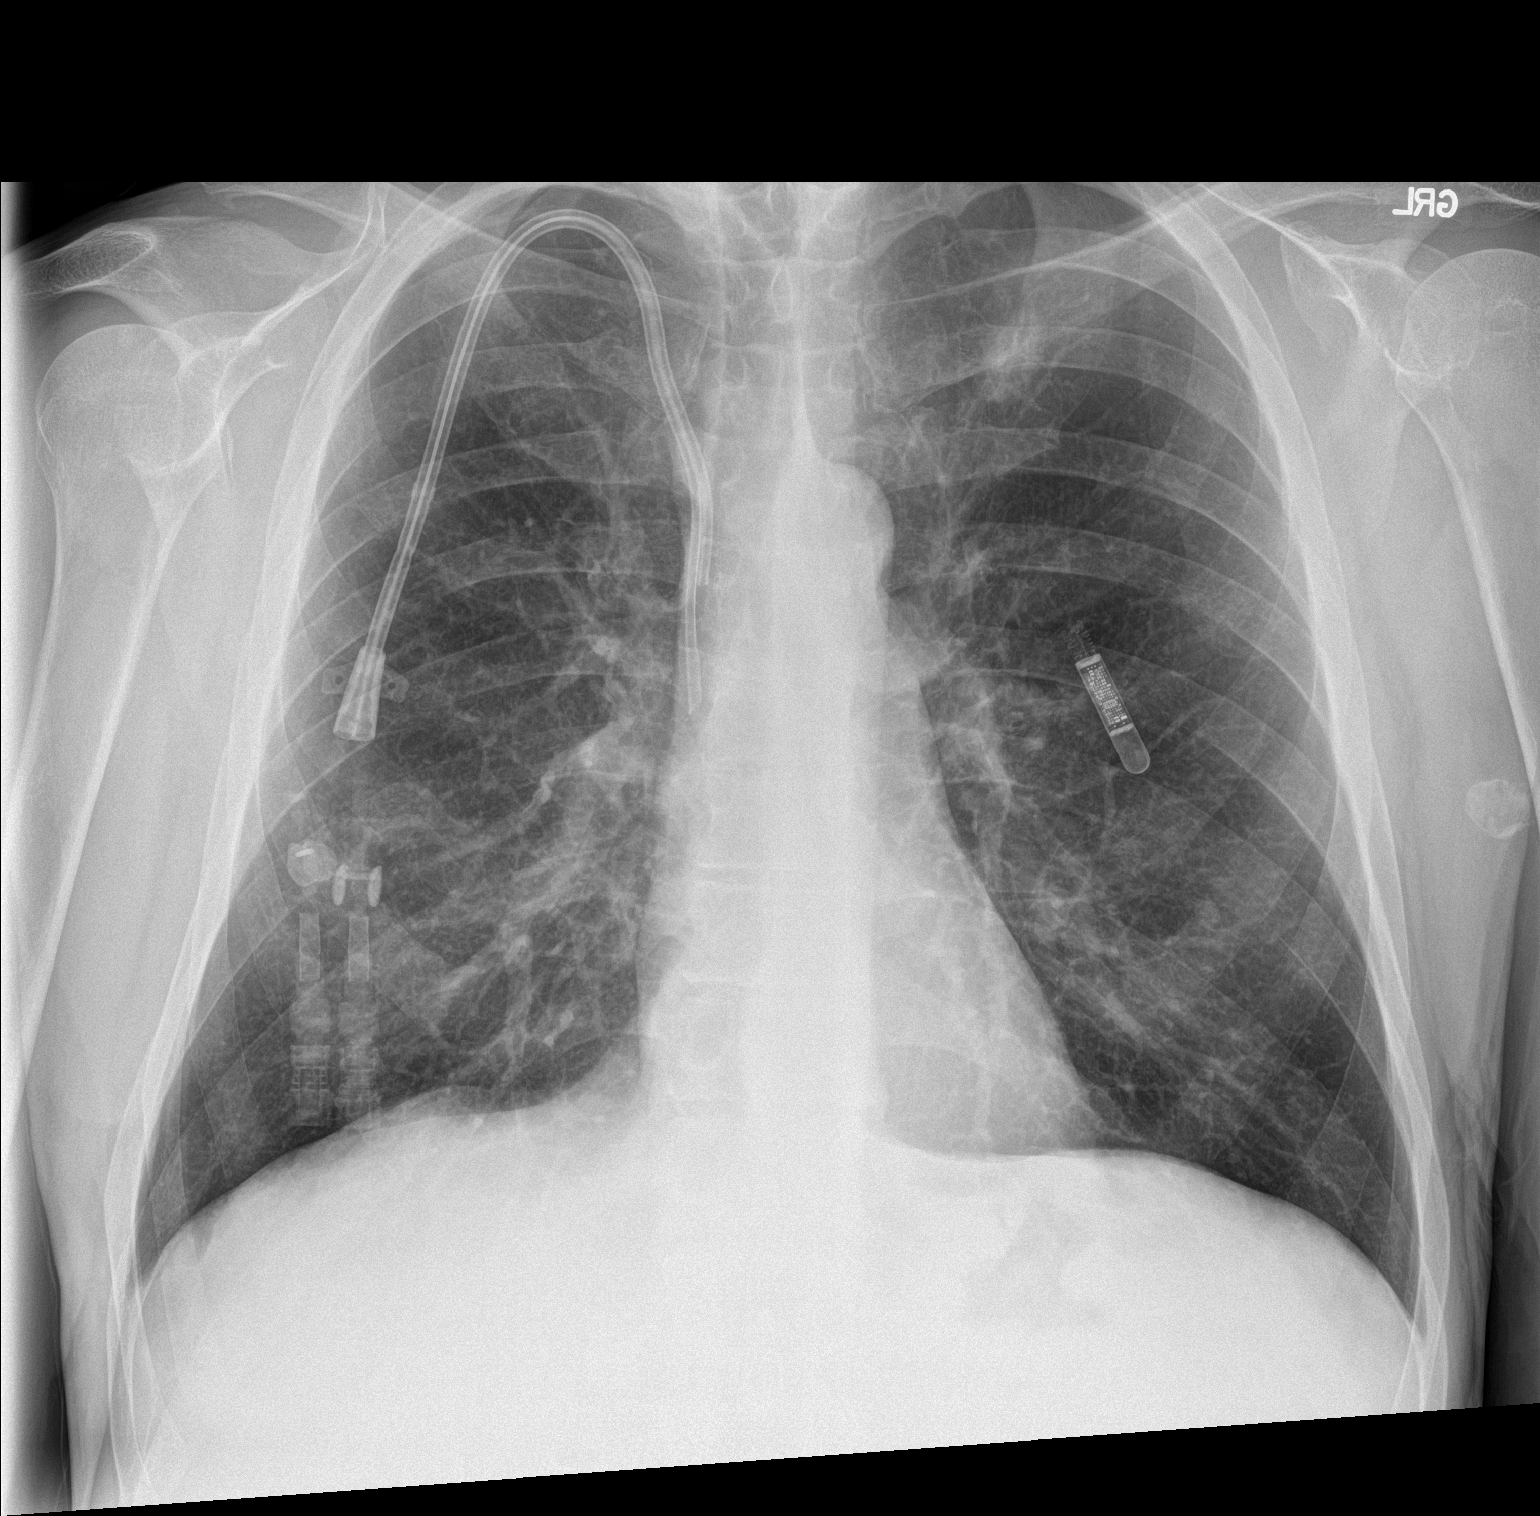

[chest lat]
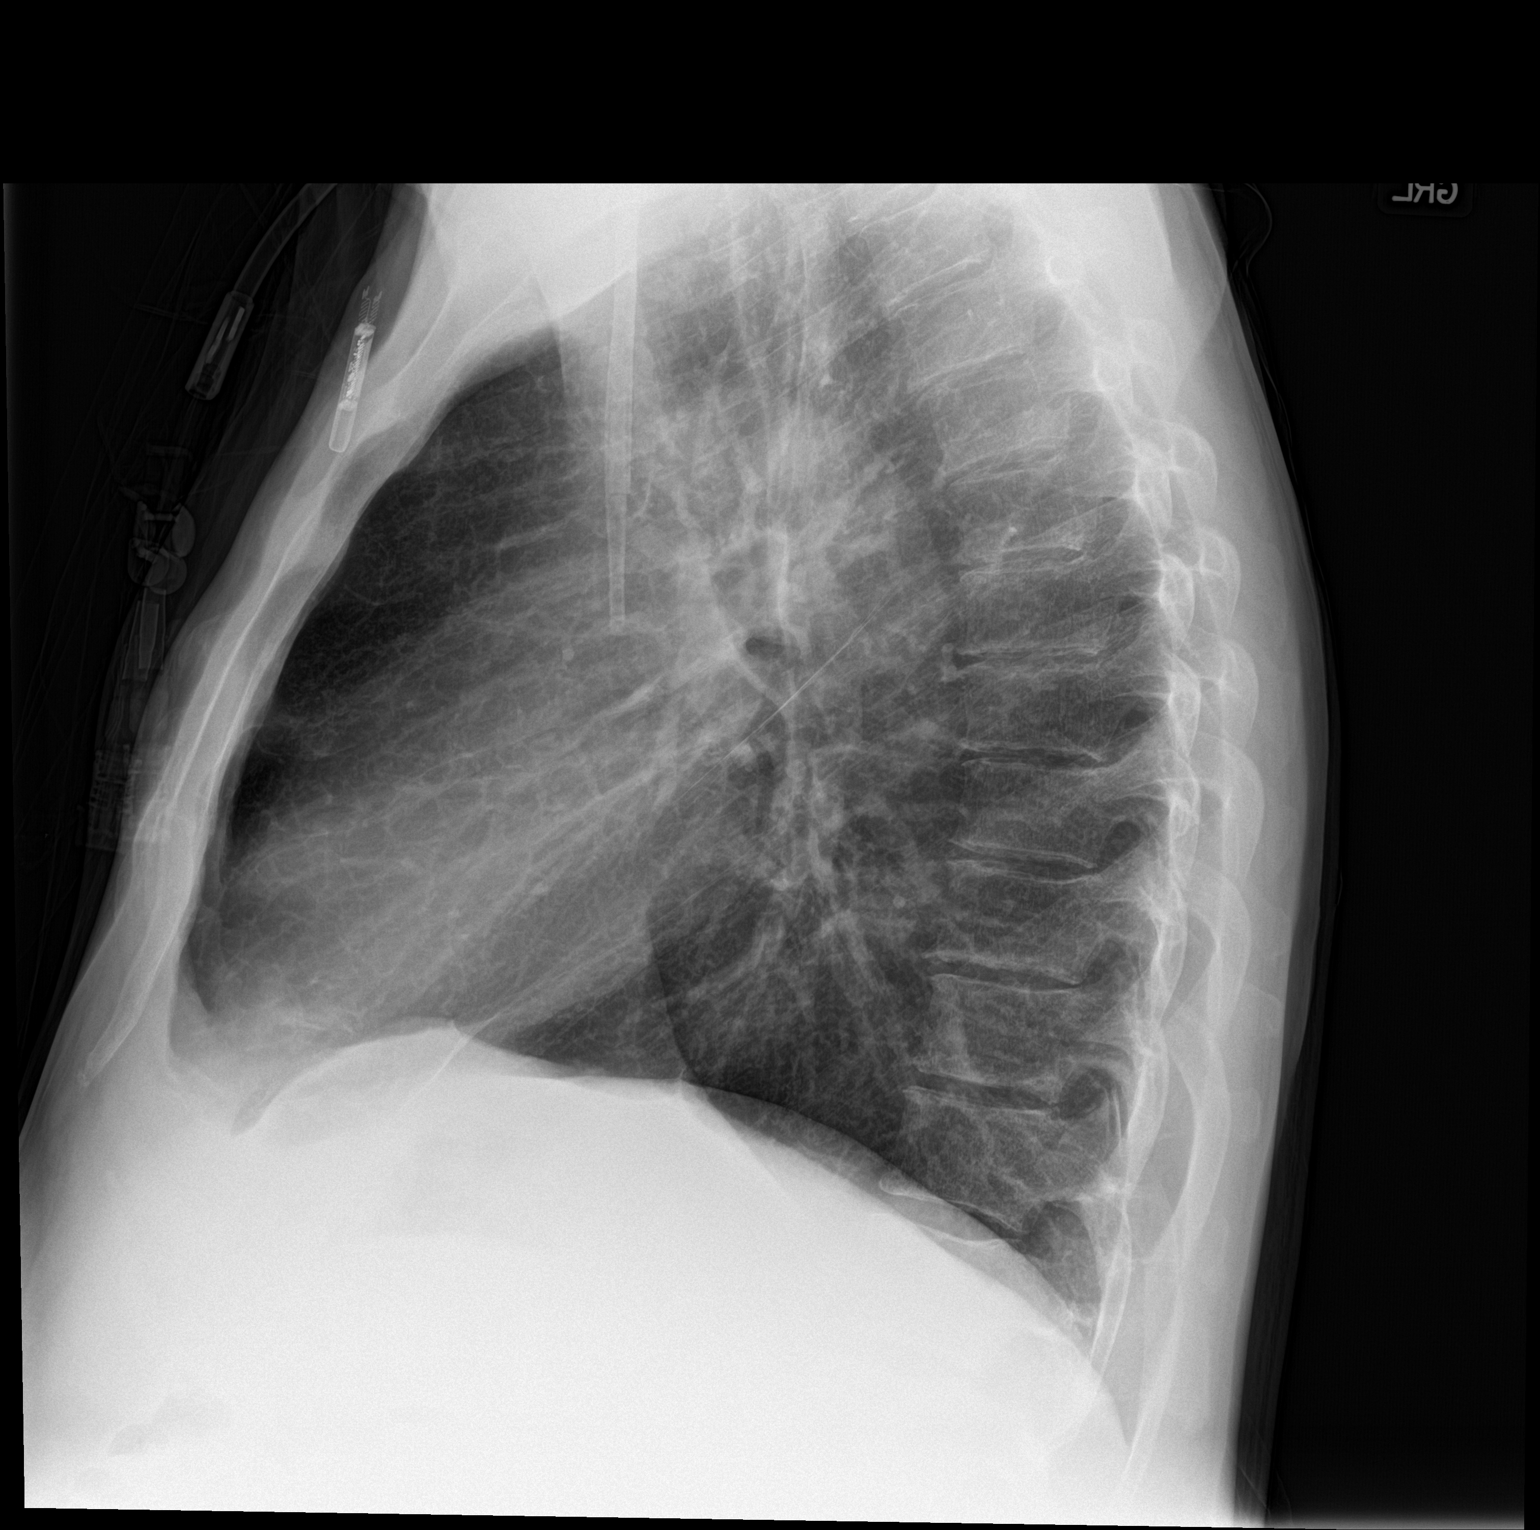

[2 of 2 positions shown; findings below may reference images not displayed]

FINDINGS: Right jugular dual-lumen central line extends into the SVC. Loop
recorder superimposes the left hemithorax.

Left upper lobe mass again evident, unchanged. The lungs are
otherwise clear. Hilar and mediastinal contours are unremarkable and
unchanged. Normal heart size. No pleural effusion. Normal pulmonary
vasculature.
IMPRESSION: 1. Left upper lobe mass, unchanged.
2. No acute cardiopulmonary findings.
